# Patient Record
Sex: Female | Born: 1985
Health system: Southern US, Community
[De-identification: ages and names within clinical notes are randomized; demographics above are authoritative.]

## PROBLEM LIST (undated history)

## (undated) DIAGNOSIS — F419 Anxiety disorder, unspecified: Secondary | ICD-10-CM

---
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
## 2006-02-19 ENCOUNTER — Ambulatory Visit (HOSPITAL_COMMUNITY): Admission: RE | Admit: 2006-02-19 | Discharge: 2006-02-19 | Payer: Self-pay | Admitting: Obstetrics and Gynecology

## 2007-07-19 ENCOUNTER — Ambulatory Visit (HOSPITAL_COMMUNITY): Admission: RE | Admit: 2007-07-19 | Discharge: 2007-07-19 | Payer: Self-pay | Admitting: Obstetrics and Gynecology

## 2009-05-05 IMAGING — CR DG CHEST 2V
2 series · 2 of 2 positions shown · non-contrast
Comparison: Chest x-ray of 02/19/06.

CLINICAL DATA: History of positive PPD.  
 CHEST ? 2 VIEW:

[view not recorded (1 of 2)]
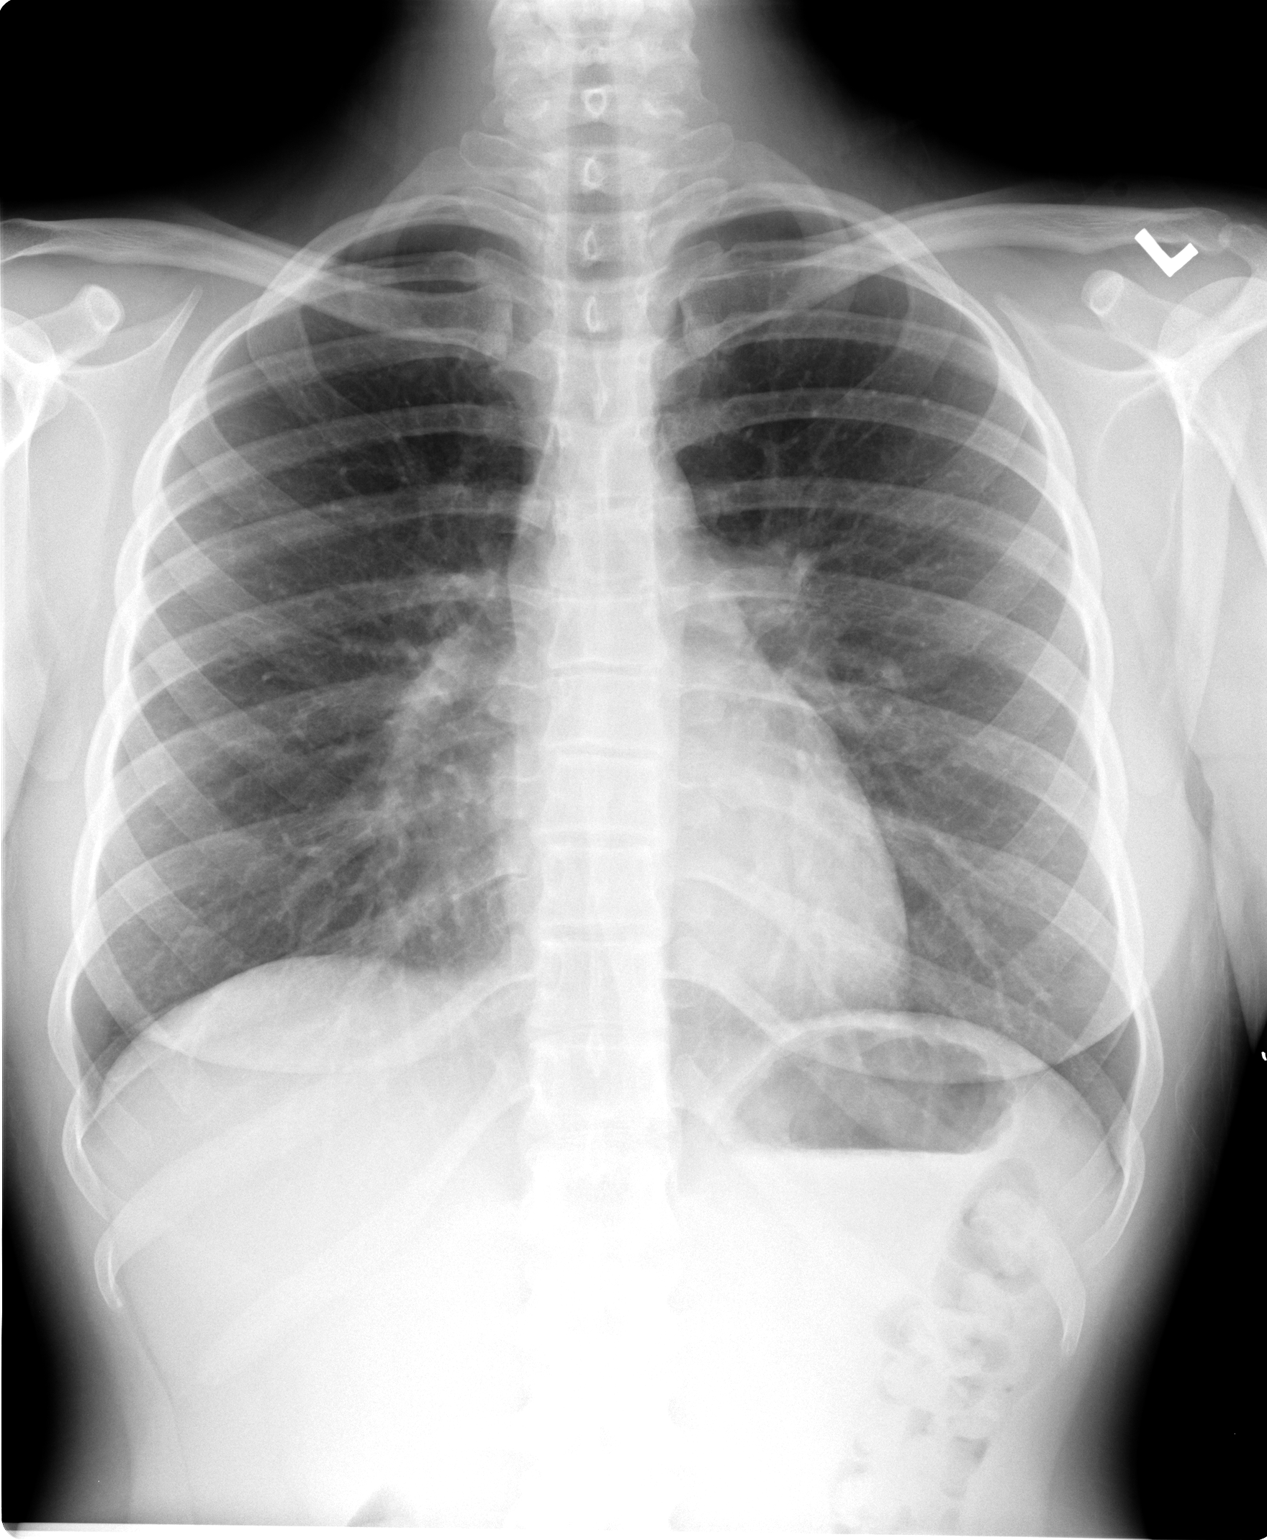

[view not recorded (2 of 2)]
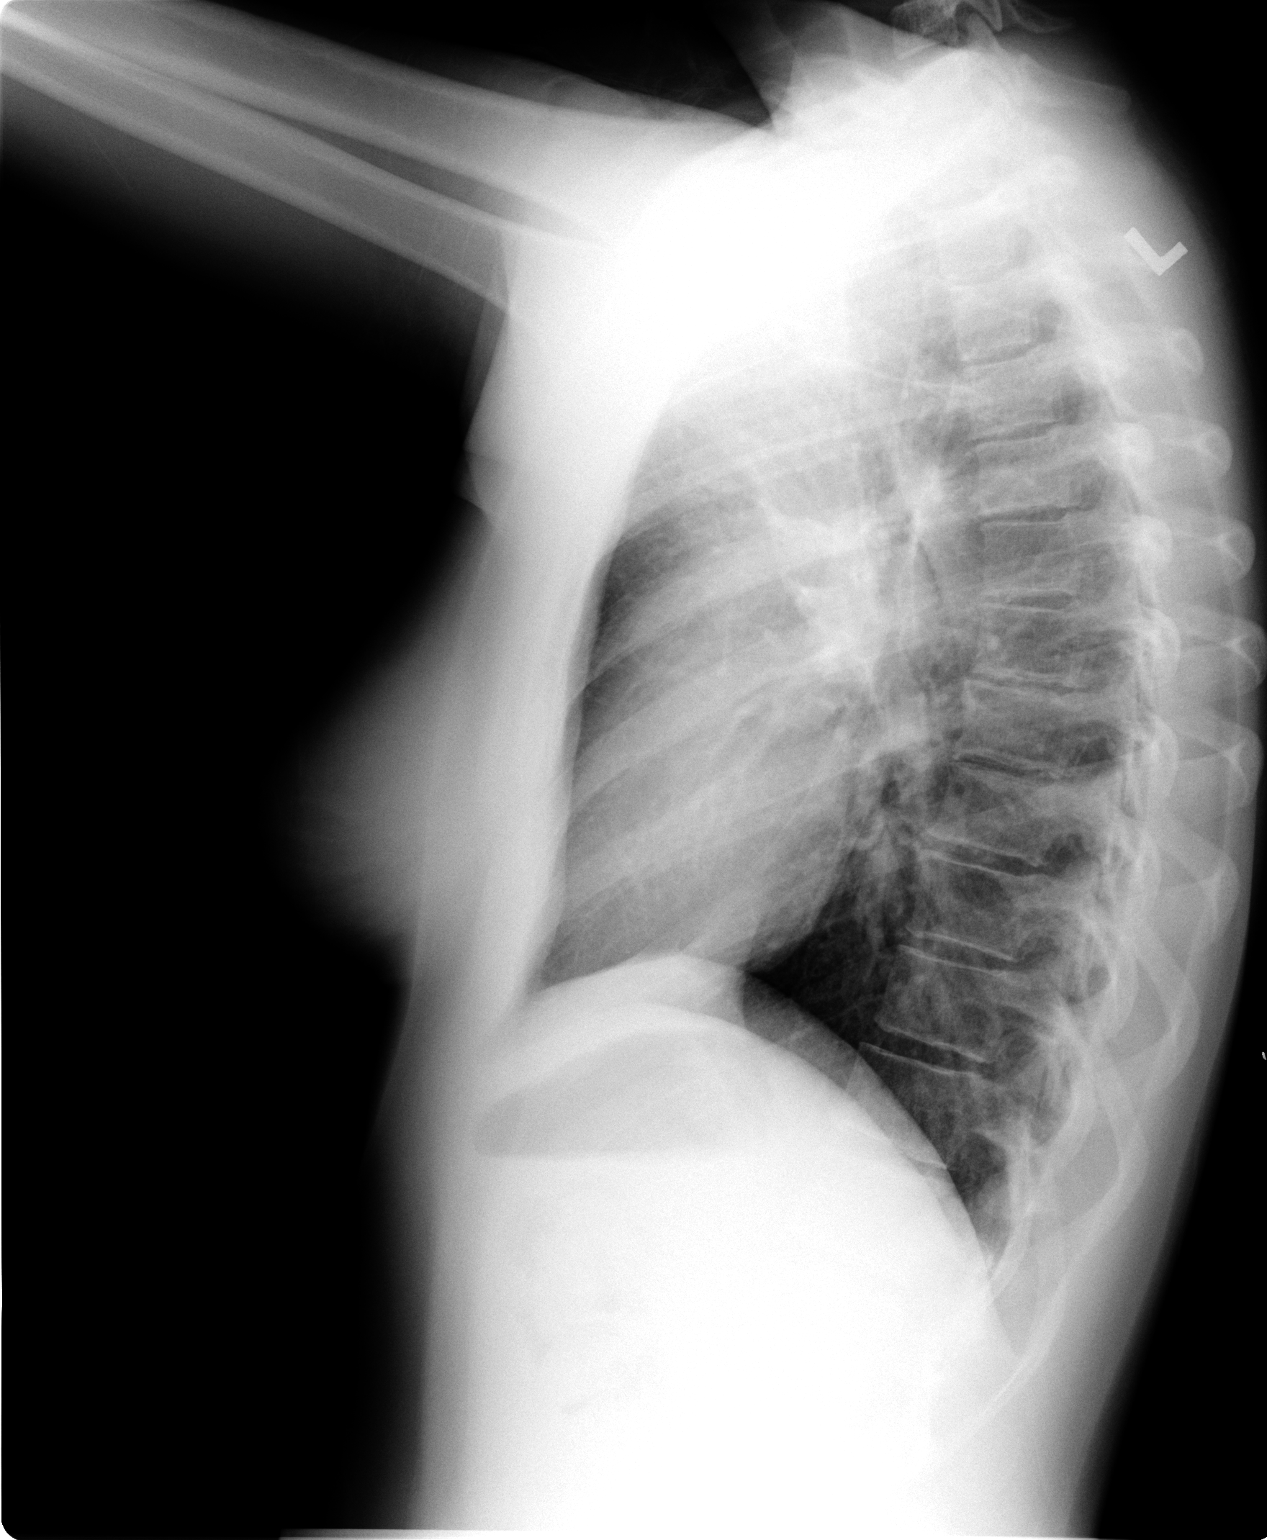

[2 of 2 positions shown; findings below may reference images not displayed]

FINDINGS: The cardiac silhouette, mediastinum and pulmonary vasculature are within normal limits.  Both lungs are clear.  The osseous structures are unremarkable.
IMPRESSION: Stable chest x-ray with no evidence of active cardiac or pulmonary disease.

## 2013-07-07 ENCOUNTER — Encounter (HOSPITAL_COMMUNITY): Payer: Self-pay | Admitting: Emergency Medicine

## 2013-07-07 ENCOUNTER — Emergency Department (HOSPITAL_COMMUNITY)
Admission: EM | Admit: 2013-07-07 | Discharge: 2013-07-07 | Discharge status: Against Medical Advice | Payer: BC Managed Care – PPO | Attending: Emergency Medicine | Admitting: Emergency Medicine

## 2013-07-07 DIAGNOSIS — F329 Major depressive disorder, single episode, unspecified: Secondary | ICD-10-CM | POA: Insufficient documentation

## 2013-07-07 DIAGNOSIS — F3289 Other specified depressive episodes: Secondary | ICD-10-CM | POA: Insufficient documentation

## 2013-07-07 NOTE — ED Notes (Signed)
Patient called for lab work at this time with no answer.

## 2013-07-07 NOTE — ED Notes (Signed)
Pt c/o feeling depressed 6 wks-2 months; no thoughts of harming self; no previous treatment for same; requesting help for depression;

## 2017-04-22 ENCOUNTER — Encounter (HOSPITAL_COMMUNITY): Payer: Self-pay | Admitting: Anesthesiology

## 2017-04-22 NOTE — Anesthesia Preprocedure Evaluation (Deleted)
Anesthesia Evaluation  Patient identified by MRN, date of birth, ID band Patient awake    Reviewed: Allergy & Precautions, H&P , Patient's Chart, lab work & pertinent test results, reviewed documented beta blocker date and time   Airway Mallampati: II  TM Distance: >3 FB Neck ROM: full    Dental no notable dental hx.    Pulmonary    Pulmonary exam normal breath sounds clear to auscultation       Cardiovascular  Rhythm:regular Rate:Normal     Neuro/Psych    GI/Hepatic   Endo/Other    Renal/GU      Musculoskeletal   Abdominal   Peds  Hematology   Anesthesia Other Findings   Reproductive/Obstetrics                             Anesthesia Physical Anesthesia Plan  ASA: II  Anesthesia Plan: General and MAC   Post-op Pain Management:    Induction: Intravenous  PONV Risk Score and Plan:   Airway Management Planned: LMA and Mask  Additional Equipment:   Intra-op Plan:   Post-operative Plan:   Informed Consent: I have reviewed the patients History and Physical, chart, labs and discussed the procedure including the risks, benefits and alternatives for the proposed anesthesia with the patient or authorized representative who has indicated his/her understanding and acceptance.   Dental Advisory Given  Plan Discussed with: CRNA and Surgeon  Anesthesia Plan Comments: ( MAC v LMA)        Anesthesia Quick Evaluation

## 2017-04-22 NOTE — H&P (Signed)
31 year old G 1 P 0 at 10 weeks presents for VIP No past medical history on file. No past surgical history on file. NKDA  Afebrile VSS General alert and oriented Lung CTAB Car RRR  Pelvic WNL 10 week size  Desires VIP D and E with ultrasound guidance Risks reviewed Consent signed

## 2017-04-23 ENCOUNTER — Ambulatory Visit (HOSPITAL_COMMUNITY)
Admission: RE | Admit: 2017-04-23 | Discharge: 2017-04-23 | Disposition: A | Discharge status: Home / Self Care | Payer: BLUE CROSS/BLUE SHIELD | Source: Ambulatory Visit | Attending: Obstetrics and Gynecology | Admitting: Obstetrics and Gynecology

## 2017-04-23 ENCOUNTER — Encounter (HOSPITAL_COMMUNITY)
Admission: RE | Disposition: A | Discharge status: Home / Self Care | Payer: Self-pay | Source: Ambulatory Visit | Attending: Obstetrics and Gynecology

## 2017-04-23 HISTORY — PX: DILATION AND EVACUATION: SHX1459

## 2017-04-23 SURGERY — DILATION AND EVACUATION, UTERUS
Anesthesia: Monitor Anesthesia Care

## 2017-04-23 MED ORDER — SCOPOLAMINE 1 MG/3DAYS TD PT72
MEDICATED_PATCH | TRANSDERMAL | Status: AC
  Filled 2017-04-23: qty 1

## 2017-04-23 MED ORDER — CEFOTETAN DISODIUM-DEXTROSE 2-2.08 GM-%(50ML) IV SOLR
INTRAVENOUS | Status: AC
  Filled 2017-04-23: qty 50

## 2017-04-23 MED ORDER — CEFOTETAN DISODIUM-DEXTROSE 2-2.08 GM-%(50ML) IV SOLR: 2.0000 g | INTRAVENOUS | Status: DC

## 2017-04-23 SURGICAL SUPPLY — 17 items
CATH ROBINSON RED A/P 16FR (CATHETERS) ×2 IMPLANT
DECANTER SPIKE VIAL GLASS SM (MISCELLANEOUS) ×2 IMPLANT
GLOVE BIO SURGEON STRL SZ 6.5 (GLOVE) ×4 IMPLANT
GLOVE BIOGEL PI IND STRL 7.0 (GLOVE) ×1 IMPLANT
GLOVE BIOGEL PI INDICATOR 7.0 (GLOVE) ×1
GOWN STRL REUS W/TWL LRG LVL3 (GOWN DISPOSABLE) ×4 IMPLANT
KIT BERKELEY 1ST TRIMESTER 3/8 (MISCELLANEOUS) ×2 IMPLANT
NS IRRIG 1000ML POUR BTL (IV SOLUTION) ×2 IMPLANT
PACK VAGINAL MINOR WOMEN LF (CUSTOM PROCEDURE TRAY) ×2 IMPLANT
PAD OB MATERNITY 4.3X12.25 (PERSONAL CARE ITEMS) ×2 IMPLANT
PAD PREP 24X48 CUFFED NSTRL (MISCELLANEOUS) ×2 IMPLANT
SET BERKELEY SUCTION TUBING (SUCTIONS) ×2 IMPLANT
TOWEL OR 17X24 6PK STRL BLUE (TOWEL DISPOSABLE) ×4 IMPLANT
VACURETTE 10 RIGID CVD (CANNULA) IMPLANT
VACURETTE 7MM CVD STRL WRAP (CANNULA) IMPLANT
VACURETTE 8 RIGID CVD (CANNULA) IMPLANT
VACURETTE 9 RIGID CVD (CANNULA) IMPLANT

## 2017-04-26 ENCOUNTER — Encounter (HOSPITAL_COMMUNITY): Payer: Self-pay | Admitting: Obstetrics and Gynecology

## 2017-10-19 LAB — OB RESULTS CONSOLE GBS: GBS: NEGATIVE

## 2017-11-12 ENCOUNTER — Inpatient Hospital Stay (HOSPITAL_COMMUNITY): Payer: BLUE CROSS/BLUE SHIELD | Admitting: Anesthesiology

## 2017-11-12 ENCOUNTER — Encounter (HOSPITAL_COMMUNITY)
Admission: AD | Disposition: A | Discharge status: Home / Self Care | Payer: Self-pay | Source: Ambulatory Visit | Attending: Obstetrics & Gynecology

## 2017-11-12 ENCOUNTER — Other Ambulatory Visit: Payer: Self-pay

## 2017-11-12 ENCOUNTER — Encounter (HOSPITAL_COMMUNITY): Payer: Self-pay | Admitting: *Deleted

## 2017-11-12 ENCOUNTER — Inpatient Hospital Stay (HOSPITAL_COMMUNITY)
Admission: AD | Admit: 2017-11-12 | Discharge: 2017-11-15 | DRG: 787 | Disposition: A | Discharge status: Home / Self Care | Payer: BLUE CROSS/BLUE SHIELD | Source: Ambulatory Visit | Attending: Obstetrics & Gynecology | Admitting: Obstetrics & Gynecology

## 2017-11-12 DIAGNOSIS — Z3483 Encounter for supervision of other normal pregnancy, third trimester: Secondary | ICD-10-CM | POA: Diagnosis present

## 2017-11-12 DIAGNOSIS — Z3A39 39 weeks gestation of pregnancy: Secondary | ICD-10-CM | POA: Diagnosis not present

## 2017-11-12 DIAGNOSIS — O4292 Full-term premature rupture of membranes, unspecified as to length of time between rupture and onset of labor: Secondary | ICD-10-CM | POA: Diagnosis present

## 2017-11-12 DIAGNOSIS — Z98891 History of uterine scar from previous surgery: Secondary | ICD-10-CM

## 2017-11-12 DIAGNOSIS — O4290 Premature rupture of membranes, unspecified as to length of time between rupture and onset of labor, unspecified weeks of gestation: Secondary | ICD-10-CM | POA: Diagnosis present

## 2017-11-12 LAB — CBC
HEMATOCRIT: 37.9 % (ref 36.0–46.0)
Hemoglobin: 12.9 g/dL (ref 12.0–15.0)
MCH: 31 pg (ref 26.0–34.0)
MCHC: 34 g/dL (ref 30.0–36.0)
MCV: 91.1 fL (ref 78.0–100.0)
Platelets: 310 10*3/uL (ref 150–400)
RBC: 4.16 MIL/uL (ref 3.87–5.11)
RDW: 13.9 % (ref 11.5–15.5)
WBC: 7.3 10*3/uL (ref 4.0–10.5)

## 2017-11-12 LAB — RPR: RPR Ser Ql: NONREACTIVE

## 2017-11-12 LAB — TYPE AND SCREEN
ABO/RH(D): O POS
Antibody Screen: NEGATIVE

## 2017-11-12 LAB — ABO/RH: ABO/RH(D): O POS

## 2017-11-12 LAB — POCT FERN TEST

## 2017-11-12 SURGERY — Surgical Case
Anesthesia: Regional

## 2017-11-12 MED ORDER — SIMETHICONE 80 MG PO CHEW
80.0000 mg | CHEWABLE_TABLET | Freq: Three times a day (TID) | ORAL | Status: DC
  Administered 2017-11-13 – 2017-11-14 (×6): 80 mg via ORAL
  Filled 2017-11-12 (×6): qty 1

## 2017-11-12 MED ORDER — FENTANYL CITRATE (PF) 100 MCG/2ML IJ SOLN
INTRAMUSCULAR | Status: AC
  Filled 2017-11-12: qty 2

## 2017-11-12 MED ORDER — SODIUM BICARBONATE 8.4 % IV SOLN
INTRAVENOUS | Status: AC
  Filled 2017-11-12: qty 50

## 2017-11-12 MED ORDER — ACETAMINOPHEN 325 MG PO TABS
650.0000 mg | ORAL_TABLET | ORAL | Status: DC | PRN
  Administered 2017-11-13 – 2017-11-14 (×2): 650 mg via ORAL
  Filled 2017-11-12 (×3): qty 2

## 2017-11-12 MED ORDER — OXYTOCIN 10 UNIT/ML IJ SOLN
INTRAVENOUS | Status: DC | PRN
  Administered 2017-11-12: 40 [IU] via INTRAVENOUS

## 2017-11-12 MED ORDER — SIMETHICONE 80 MG PO CHEW
80.0000 mg | CHEWABLE_TABLET | ORAL | Status: DC
  Administered 2017-11-13 – 2017-11-14 (×2): 80 mg via ORAL
  Filled 2017-11-12 (×2): qty 1

## 2017-11-12 MED ORDER — KETOROLAC TROMETHAMINE 60 MG/2ML IM SOLN
INTRAMUSCULAR | Status: AC
  Filled 2017-11-12: qty 2

## 2017-11-12 MED ORDER — OXYTOCIN 40 UNITS IN LACTATED RINGERS INFUSION - SIMPLE MED
1.0000 m[IU]/min | INTRAVENOUS | Status: DC
  Administered 2017-11-12: 2 m[IU]/min via INTRAVENOUS
  Filled 2017-11-12: qty 1000

## 2017-11-12 MED ORDER — LACTATED RINGERS IV SOLN
INTRAVENOUS | Status: DC
  Administered 2017-11-13: 03:00:00 via INTRAVENOUS

## 2017-11-12 MED ORDER — PROPOFOL 10 MG/ML IV BOLUS
INTRAVENOUS | Status: AC
  Filled 2017-11-12: qty 20

## 2017-11-12 MED ORDER — EPHEDRINE 5 MG/ML INJ: 10.0000 mg | INTRAVENOUS | Status: DC | PRN

## 2017-11-12 MED ORDER — CLINDAMYCIN PHOSPHATE 900 MG/50ML IV SOLN
900.0000 mg | Freq: Once | INTRAVENOUS | Status: AC
  Administered 2017-11-12: 900 mg via INTRAVENOUS
  Filled 2017-11-12: qty 50

## 2017-11-12 MED ORDER — OXYTOCIN 10 UNIT/ML IJ SOLN
INTRAMUSCULAR | Status: AC
  Filled 2017-11-12: qty 1

## 2017-11-12 MED ORDER — LACTATED RINGERS IV SOLN
500.0000 mL | INTRAVENOUS | Status: DC | PRN
  Administered 2017-11-12: 1000 mL via INTRAVENOUS
  Administered 2017-11-12: 500 mL via INTRAVENOUS

## 2017-11-12 MED ORDER — SODIUM CHLORIDE 0.9 % IR SOLN
Status: DC | PRN
  Administered 2017-11-12: 1000 mL

## 2017-11-12 MED ORDER — MORPHINE SULFATE (PF) 0.5 MG/ML IJ SOLN
INTRAMUSCULAR | Status: AC
Start: 2017-11-12 — End: ?
  Filled 2017-11-12: qty 10

## 2017-11-12 MED ORDER — DIBUCAINE 1 % RE OINT: 1.0000 "application " | TOPICAL_OINTMENT | RECTAL | Status: DC | PRN

## 2017-11-12 MED ORDER — MENTHOL 3 MG MT LOZG: 1.0000 | LOZENGE | OROMUCOSAL | Status: DC | PRN

## 2017-11-12 MED ORDER — OXYCODONE-ACETAMINOPHEN 5-325 MG PO TABS
2.0000 | ORAL_TABLET | ORAL | Status: DC | PRN
Start: 2017-11-12 — End: 2017-11-12

## 2017-11-12 MED ORDER — DEXAMETHASONE SODIUM PHOSPHATE 10 MG/ML IJ SOLN
INTRAMUSCULAR | Status: DC | PRN
  Administered 2017-11-12: 10 mg via INTRAVENOUS

## 2017-11-12 MED ORDER — DIPHENHYDRAMINE HCL 50 MG/ML IJ SOLN: 12.5000 mg | INTRAMUSCULAR | Status: DC | PRN

## 2017-11-12 MED ORDER — SENNOSIDES-DOCUSATE SODIUM 8.6-50 MG PO TABS
2.0000 | ORAL_TABLET | ORAL | Status: DC
  Administered 2017-11-13 – 2017-11-14 (×2): 2 via ORAL
  Filled 2017-11-12 (×2): qty 2

## 2017-11-12 MED ORDER — GENTAMICIN SULFATE 40 MG/ML IJ SOLN
Freq: Once | INTRAVENOUS | Status: AC
  Administered 2017-11-12: 19:00:00 via INTRAVENOUS
  Filled 2017-11-12: qty 4

## 2017-11-12 MED ORDER — LACTATED RINGERS IV SOLN
INTRAVENOUS | Status: DC | PRN
  Administered 2017-11-12: 21:00:00 via INTRAVENOUS

## 2017-11-12 MED ORDER — ONDANSETRON HCL 4 MG/2ML IJ SOLN
4.0000 mg | Freq: Four times a day (QID) | INTRAMUSCULAR | Status: DC | PRN
  Administered 2017-11-12: 4 mg via INTRAVENOUS
  Filled 2017-11-12: qty 2

## 2017-11-12 MED ORDER — ZOLPIDEM TARTRATE 5 MG PO TABS: 5.0000 mg | ORAL_TABLET | Freq: Every evening | ORAL | Status: DC | PRN

## 2017-11-12 MED ORDER — DIPHENHYDRAMINE HCL 25 MG PO CAPS
25.0000 mg | ORAL_CAPSULE | Freq: Four times a day (QID) | ORAL | Status: DC | PRN
  Filled 2017-11-12: qty 1

## 2017-11-12 MED ORDER — SCOPOLAMINE 1 MG/3DAYS TD PT72
MEDICATED_PATCH | TRANSDERMAL | Status: DC | PRN
  Administered 2017-11-12: 1 via TRANSDERMAL

## 2017-11-12 MED ORDER — PHENYLEPHRINE 40 MCG/ML (10ML) SYRINGE FOR IV PUSH (FOR BLOOD PRESSURE SUPPORT)
PREFILLED_SYRINGE | INTRAVENOUS | Status: AC
  Filled 2017-11-12: qty 10

## 2017-11-12 MED ORDER — PRENATAL MULTIVITAMIN CH
1.0000 | ORAL_TABLET | Freq: Every day | ORAL | Status: DC
  Administered 2017-11-13 – 2017-11-14 (×2): 1 via ORAL
  Filled 2017-11-12 (×2): qty 1

## 2017-11-12 MED ORDER — SIMETHICONE 80 MG PO CHEW: 80.0000 mg | CHEWABLE_TABLET | ORAL | Status: DC | PRN

## 2017-11-12 MED ORDER — FENTANYL 2.5 MCG/ML BUPIVACAINE 1/10 % EPIDURAL INFUSION (WH - ANES)
14.0000 mL/h | INTRAMUSCULAR | Status: DC | PRN
  Administered 2017-11-12: 14 mL/h via EPIDURAL
  Filled 2017-11-12: qty 100

## 2017-11-12 MED ORDER — WITCH HAZEL-GLYCERIN EX PADS: 1.0000 "application " | MEDICATED_PAD | CUTANEOUS | Status: DC | PRN

## 2017-11-12 MED ORDER — MIDAZOLAM HCL 2 MG/2ML IJ SOLN: 0.5000 mg | Freq: Once | INTRAMUSCULAR | Status: DC | PRN

## 2017-11-12 MED ORDER — TETANUS-DIPHTH-ACELL PERTUSSIS 5-2.5-18.5 LF-MCG/0.5 IM SUSP: 0.5000 mL | Freq: Once | INTRAMUSCULAR | Status: DC

## 2017-11-12 MED ORDER — KETOROLAC TROMETHAMINE 30 MG/ML IJ SOLN
INTRAMUSCULAR | Status: AC
  Filled 2017-11-12: qty 1

## 2017-11-12 MED ORDER — ONDANSETRON HCL 4 MG/2ML IJ SOLN
INTRAMUSCULAR | Status: AC
  Filled 2017-11-12: qty 2

## 2017-11-12 MED ORDER — FLEET ENEMA 7-19 GM/118ML RE ENEM: 1.0000 | ENEMA | RECTAL | Status: DC | PRN

## 2017-11-12 MED ORDER — TERBUTALINE SULFATE 1 MG/ML IJ SOLN: 0.2500 mg | Freq: Once | INTRAMUSCULAR | Status: DC | PRN

## 2017-11-12 MED ORDER — FENTANYL 2.5 MCG/ML BUPIVACAINE 1/10 % EPIDURAL INFUSION (WH - ANES)
INTRAMUSCULAR | Status: AC
  Filled 2017-11-12: qty 100

## 2017-11-12 MED ORDER — LIDOCAINE HCL (PF) 1 % IJ SOLN: 30.0000 mL | INTRAMUSCULAR | Status: DC | PRN

## 2017-11-12 MED ORDER — SODIUM BICARBONATE 8.4 % IV SOLN
INTRAVENOUS | Status: DC | PRN
  Administered 2017-11-12: 5 mL via EPIDURAL
  Administered 2017-11-12: 3 mL via EPIDURAL

## 2017-11-12 MED ORDER — OXYTOCIN BOLUS FROM INFUSION: 500.0000 mL | Freq: Once | INTRAVENOUS | Status: DC

## 2017-11-12 MED ORDER — BUTORPHANOL TARTRATE 1 MG/ML IJ SOLN: 1.0000 mg | INTRAMUSCULAR | Status: DC | PRN

## 2017-11-12 MED ORDER — OXYCODONE-ACETAMINOPHEN 5-325 MG PO TABS
1.0000 | ORAL_TABLET | ORAL | Status: DC | PRN
  Administered 2017-11-14 (×2): 1 via ORAL
  Filled 2017-11-12 (×3): qty 1

## 2017-11-12 MED ORDER — LIDOCAINE-EPINEPHRINE (PF) 2 %-1:200000 IJ SOLN
INTRAMUSCULAR | Status: DC | PRN
  Administered 2017-11-12 (×2): 3 mL via EPIDURAL
  Administered 2017-11-12: 5 mL via EPIDURAL

## 2017-11-12 MED ORDER — PROMETHAZINE HCL 25 MG/ML IJ SOLN: 6.2500 mg | INTRAMUSCULAR | Status: DC | PRN

## 2017-11-12 MED ORDER — ONDANSETRON HCL 4 MG/2ML IJ SOLN
INTRAMUSCULAR | Status: DC | PRN
  Administered 2017-11-12: 4 mg via INTRAVENOUS

## 2017-11-12 MED ORDER — DEXAMETHASONE SODIUM PHOSPHATE 10 MG/ML IJ SOLN
INTRAMUSCULAR | Status: AC
  Filled 2017-11-12: qty 1

## 2017-11-12 MED ORDER — COCONUT OIL OIL
1.0000 "application " | TOPICAL_OIL | Status: DC | PRN
  Administered 2017-11-14: 1 via TOPICAL
  Filled 2017-11-12: qty 120

## 2017-11-12 MED ORDER — FENTANYL CITRATE (PF) 100 MCG/2ML IJ SOLN
INTRAMUSCULAR | Status: DC | PRN
  Administered 2017-11-12: 100 ug via EPIDURAL
  Administered 2017-11-12: 100 ug via INTRAVENOUS

## 2017-11-12 MED ORDER — PHENYLEPHRINE 40 MCG/ML (10ML) SYRINGE FOR IV PUSH (FOR BLOOD PRESSURE SUPPORT): 80.0000 ug | PREFILLED_SYRINGE | INTRAVENOUS | Status: DC | PRN

## 2017-11-12 MED ORDER — NALBUPHINE HCL 10 MG/ML IJ SOLN: 5.0000 mg | INTRAMUSCULAR | Status: DC | PRN

## 2017-11-12 MED ORDER — FENTANYL CITRATE (PF) 100 MCG/2ML IJ SOLN
50.0000 ug | Freq: Once | INTRAMUSCULAR | Status: AC
  Administered 2017-11-12: 50 ug via INTRAVENOUS

## 2017-11-12 MED ORDER — SOD CITRATE-CITRIC ACID 500-334 MG/5ML PO SOLN
30.0000 mL | ORAL | Status: DC | PRN
  Administered 2017-11-12: 30 mL via ORAL
  Filled 2017-11-12: qty 15

## 2017-11-12 MED ORDER — LACTATED RINGERS IV SOLN
INTRAVENOUS | Status: DC
  Administered 2017-11-12 (×2): via INTRAVENOUS

## 2017-11-12 MED ORDER — OXYTOCIN 40 UNITS IN LACTATED RINGERS INFUSION - SIMPLE MED: 2.5000 [IU]/h | INTRAVENOUS | Status: DC

## 2017-11-12 MED ORDER — OXYCODONE-ACETAMINOPHEN 5-325 MG PO TABS: 2.0000 | ORAL_TABLET | ORAL | Status: DC | PRN

## 2017-11-12 MED ORDER — SODIUM CHLORIDE 0.9 % IV SOLN
2.0000 g | Freq: Once | INTRAVENOUS | Status: AC
  Administered 2017-11-12: 2 g via INTRAVENOUS
  Filled 2017-11-12: qty 2

## 2017-11-12 MED ORDER — MEPERIDINE HCL 25 MG/ML IJ SOLN: 6.2500 mg | INTRAMUSCULAR | Status: DC | PRN

## 2017-11-12 MED ORDER — ACETAMINOPHEN 325 MG PO TABS
650.0000 mg | ORAL_TABLET | ORAL | Status: DC | PRN
  Administered 2017-11-12: 650 mg via ORAL
  Filled 2017-11-12: qty 2

## 2017-11-12 MED ORDER — KETOROLAC TROMETHAMINE 30 MG/ML IJ SOLN
30.0000 mg | Freq: Once | INTRAMUSCULAR | Status: AC
  Administered 2017-11-12: 30 mg via INTRAVENOUS

## 2017-11-12 MED ORDER — LIDOCAINE-EPINEPHRINE (PF) 2 %-1:200000 IJ SOLN
INTRAMUSCULAR | Status: AC
  Filled 2017-11-12: qty 20

## 2017-11-12 MED ORDER — MORPHINE SULFATE (PF) 0.5 MG/ML IJ SOLN
INTRAMUSCULAR | Status: DC | PRN
  Administered 2017-11-12: 3 mg via EPIDURAL

## 2017-11-12 MED ORDER — LIDOCAINE HCL (PF) 1 % IJ SOLN
INTRAMUSCULAR | Status: DC | PRN
  Administered 2017-11-12 (×2): 5 mL via EPIDURAL

## 2017-11-12 MED ORDER — OXYCODONE-ACETAMINOPHEN 5-325 MG PO TABS: 1.0000 | ORAL_TABLET | ORAL | Status: DC | PRN

## 2017-11-12 MED ORDER — OXYTOCIN 40 UNITS IN LACTATED RINGERS INFUSION - SIMPLE MED: 1.0000 m[IU]/min | INTRAVENOUS | Status: DC

## 2017-11-12 MED ORDER — IBUPROFEN 600 MG PO TABS
600.0000 mg | ORAL_TABLET | Freq: Four times a day (QID) | ORAL | Status: DC
  Administered 2017-11-13 – 2017-11-15 (×9): 600 mg via ORAL
  Filled 2017-11-12 (×9): qty 1

## 2017-11-12 MED ORDER — LACTATED RINGERS IV SOLN: 500.0000 mL | Freq: Once | INTRAVENOUS | Status: DC

## 2017-11-12 MED ORDER — MORPHINE SULFATE (PF) 4 MG/ML IV SOLN: 1.0000 mg | INTRAVENOUS | Status: DC | PRN

## 2017-11-12 MED ORDER — OXYTOCIN 40 UNITS IN LACTATED RINGERS INFUSION - SIMPLE MED: 2.5000 [IU]/h | INTRAVENOUS | Status: AC

## 2017-11-12 SURGICAL SUPPLY — 34 items
ADH SKN CLS APL DERMABOND .7 (GAUZE/BANDAGES/DRESSINGS)
APL SKNCLS STERI-STRIP NONHPOA (GAUZE/BANDAGES/DRESSINGS) ×1
BENZOIN TINCTURE PRP APPL 2/3 (GAUZE/BANDAGES/DRESSINGS) ×2 IMPLANT
CHLORAPREP W/TINT 26ML (MISCELLANEOUS) ×2 IMPLANT
CLAMP CORD UMBIL (MISCELLANEOUS) IMPLANT
CLOTH BEACON ORANGE TIMEOUT ST (SAFETY) ×2 IMPLANT
CLSR STERI-STRIP ANTIMIC 1/2X4 (GAUZE/BANDAGES/DRESSINGS) ×2 IMPLANT
DERMABOND ADVANCED (GAUZE/BANDAGES/DRESSINGS)
DERMABOND ADVANCED .7 DNX12 (GAUZE/BANDAGES/DRESSINGS) IMPLANT
DRSG OPSITE POSTOP 4X10 (GAUZE/BANDAGES/DRESSINGS) ×2 IMPLANT
ELECT REM PT RETURN 9FT ADLT (ELECTROSURGICAL) ×2
ELECTRODE REM PT RTRN 9FT ADLT (ELECTROSURGICAL) ×1 IMPLANT
EXTRACTOR VACUUM KIWI (MISCELLANEOUS) IMPLANT
GLOVE BIO SURGEON STRL SZ 6 (GLOVE) ×2 IMPLANT
GLOVE BIOGEL PI IND STRL 6 (GLOVE) ×2 IMPLANT
GLOVE BIOGEL PI IND STRL 7.0 (GLOVE) ×1 IMPLANT
GLOVE BIOGEL PI INDICATOR 6 (GLOVE) ×2
GLOVE BIOGEL PI INDICATOR 7.0 (GLOVE) ×1
GOWN STRL REUS W/TWL LRG LVL3 (GOWN DISPOSABLE) ×4 IMPLANT
KIT ABG SYR 3ML LUER SLIP (SYRINGE) ×2 IMPLANT
NEEDLE HYPO 25X5/8 SAFETYGLIDE (NEEDLE) ×2 IMPLANT
NS IRRIG 1000ML POUR BTL (IV SOLUTION) ×2 IMPLANT
PACK C SECTION WH (CUSTOM PROCEDURE TRAY) ×2 IMPLANT
PAD OB MATERNITY 4.3X12.25 (PERSONAL CARE ITEMS) ×2 IMPLANT
PENCIL SMOKE EVAC W/HOLSTER (ELECTROSURGICAL) ×2 IMPLANT
STRIP CLOSURE SKIN 1/2X4 (GAUZE/BANDAGES/DRESSINGS) IMPLANT
SUT CHROMIC 0 CTX 36 (SUTURE) ×6 IMPLANT
SUT MON AB 2-0 CT1 27 (SUTURE) ×2 IMPLANT
SUT PDS AB 0 CT1 27 (SUTURE) IMPLANT
SUT PLAIN 0 NONE (SUTURE) IMPLANT
SUT VIC AB 0 CT1 36 (SUTURE) IMPLANT
SUT VIC AB 4-0 KS 27 (SUTURE) IMPLANT
TOWEL OR 17X24 6PK STRL BLUE (TOWEL DISPOSABLE) ×2 IMPLANT
TRAY FOLEY W/BAG SLVR 14FR LF (SET/KITS/TRAYS/PACK) IMPLANT

## 2017-11-12 NOTE — Anesthesia Pain Management Evaluation Note (Signed)
  CRNA Pain Management Visit Note  Patient: Brianna Bell, 32 y.o., female  "Hello I am a member of the anesthesia team at [facility]. We have an anesthesia team available at all times to provide care throughout the hospital, including epidural management and anesthesia for C-section. I don't know your plan for the delivery whether it a natural birth, water birth, IV sedation, nitrous supplementation, doula or epidural, but we want to meet your pain goals."   1.Was your pain managed to your expectations on prior hospitalizations?   No prior hospitalizations  2.What is your expectation for pain management during this hospitalization?     Epidural  3.How can we help you reach that goal? Support prn  Record the patient's initial score and the patient's pain goal.   Pain: 2  Pain Goal: 4 The [facility] wants you to be able to say your pain was always managed very well.  [facility] 11/12/2017

## 2017-11-12 NOTE — MAU Note (Signed)
Water broke at 0345 cloudy fluid

## 2017-11-12 NOTE — Progress Notes (Signed)
CVX 8/50 with edema/-1.   MVUs adequate x 2 h.  Comfortable now after CLEA redose Recommend C/S for arrest of dilation.  Patient is counseled re: risk of bleeding, infection, scarring, and damage to surrounding structures.  She understands 1% risk of uterine rupture in subsequent pregnancies.  All questions were answered and the patient wishes to proceed.  Mitchel Honour, DO

## 2017-11-12 NOTE — Consult Note (Signed)
Neonatology Note:   Attendance at C-section:    I was asked by Dr. Langston Masker to attend this primary C/S at term due to failure of dilatation. The mother is a G1P0 O pos, GBS neg with an uncomplicated pregnancy. ROM 18 hours prior to delivery, fluid clear. Mother developed temperature elevation to 100.5 degrees and was treated with 1 dose each of Ampicillin and Gentamicin about 2-2.5 hours before delivery. Some FHR decelerations during labor. Infant vigorous with good spontaneous cry and tone. Delayed cord clamping was done. Needed only minimal bulb suctioning. Ap 8/9. Lungs clear to ausc in DR. Infant is able to remain with his mother for skin to skin time under nursing supervision. Transferred to the care of Pediatrician.   Doretha Sou, MD

## 2017-11-12 NOTE — Op Note (Signed)
Brianna Bell PROCEDURE DATE: 11/12/2017  PREOPERATIVE DIAGNOSIS: Intrauterine pregnancy at  [redacted]w[redacted]d weeks gestation, arrest of dilation  POSTOPERATIVE DIAGNOSIS: The same with OP presentation  PROCEDURE:  Primary Low Transverse Cesarean Section  SURGEON:  Dr. Mitchel Honour  INDICATIONS: Brianna Bell is a 32 y.o. G1P0 at [redacted]w[redacted]d scheduled for cesarean section secondary to arrest of dilation.  The risks of cesarean section discussed with the patient included but were not limited to: bleeding which may require transfusion or reoperation; infection which may require antibiotics; injury to bowel, bladder, ureters or other surrounding organs; injury to the fetus; need for additional procedures including hysterectomy in the event of a life-threatening hemorrhage; placental abnormalities wth subsequent pregnancies, incisional problems, thromboembolic phenomenon and other postoperative/anesthesia complications. The patient concurred with the proposed plan, giving informed written consent for the procedure.    FINDINGS:  Viable female infant in cephalic presentation (OP), APGARs 8,9: weight pending  Clear amniotic fluid.  Intact placenta, three vessel cord.  Grossly normal uterus, ovaries and fallopian tubes. .   ANESTHESIA:    Epidural ESTIMATED BLOOD LOSS: 628 mL ml SPECIMENS: Placenta sent to L&D COMPLICATIONS: None immediate  PROCEDURE IN DETAIL:  The patient received intravenous antibiotics and had sequential compression devices applied to her lower extremities while in the preoperative area.  She was then taken to the operating room where epidural anesthesia was dosed up to surgical level and was found to be adequate. She was then placed in a dorsal supine position with a leftward tilt, and prepped and draped in a sterile manner.  A foley catheter was placed into her bladder and attached to constant gravity.  After an adequate timeout was performed, a Pfannenstiel skin incision was made with scalpel  and carried through to the underlying layer of fascia. The fascia was incised in the midline and this incision was extended bilaterally using the Mayo scissors. Kocher clamps were applied to the superior aspect of the fascial incision and the underlying rectus muscles were dissected off bluntly. A similar process was carried out on the inferior aspect of the facial incision. The rectus muscles were separated in the midline bluntly and the peritoneum was entered bluntly.  Bladder flap was created sharply and developed bluntly.  Bladder was protected behind the bladder blade. A transverse hysterotomy was made with a scalpel and extended bilaterally bluntly. The bladder blade was then removed. The infant was successfully delivered, and cord was clamped and cut and infant was handed over to awaiting neonatology team. Uterine massage was then administered and the placenta delivered intact with three-vessel cord. The uterus was cleared of clot and debris.  The hysterotomy was closed with 0 chromic.  A second imbricating suture of 0-chromic was used to reinforce the incision and aid in hemostasis.  The peritoneum and rectus muscles were noted to be hemostatic and were reapproximated using 3-0 monocryl in a running fashion.  The fascia was closed with 0-Vicryl in a running fashion with good restoration of anatomy.  The subcutaneus tissue was copiously irrigated.  The skin was closed with 4-0 vicryl in a subcuticular fashion.  Pt tolerated the procedure will.  All counts were correct x2.  Pt went to the recovery room in stable condition.

## 2017-11-12 NOTE — H&P (Signed)
Brianna Bell is a 32 y.o. female presenting for SROM at 47.  CTX have started to increase in intensity.  No VB.  Active FM.  Antepartum course uncomplicated.  GBS negative.  OB History    Gravida  1   Para      Term      Preterm      AB      Living        SAB      TAB      Ectopic      Multiple      Live Births             History reviewed. No pertinent past medical history. Past Surgical History:  Procedure Laterality Date  . DILATION AND EVACUATION N/A 04/23/2017   Procedure: DILATATION AND EVACUATION with ultrasound guidance;  Surgeon: Marcelle Overlie, MD;  Location: [facility];  Service: Gynecology;  Laterality: N/A;   Family History: family history is not on file. Social History:  reports that she has never smoked. She has never used smokeless tobacco. She reports that she drank alcohol. She reports that she does not use drugs.     Maternal Diabetes: No Genetic Screening: Normal Maternal Ultrasounds/Referrals: Normal Fetal Ultrasounds or other Referrals:  None Maternal Substance Abuse:  No Significant Maternal Medications:  None Significant Maternal Lab Results:  Lab values include: Group B Strep negative Other Comments:  None  ROS Maternal Medical History:  Reason for admission: Rupture of membranes.   Contractions: Onset was 3-5 hours ago.   Frequency: irregular.   Perceived severity is mild.    Fetal activity: Perceived fetal activity is normal.   Last perceived fetal movement was within the past hour.    Prenatal complications: no prenatal complications Prenatal Complications - Diabetes: none.    Dilation: 1 Effacement (%): 70 Station: -3 Exam by:: [email or web address]-[email or web address], rnc  Blood pressure 127/84, pulse 92, temperature 97.8 F (36.6 C), temperature source Oral, resp. rate 14, height  (1.626 m), weight 180 lb 12 oz (82 kg). Maternal Exam:  Uterine Assessment: Contraction strength is mild.  Contraction frequency is irregular.   Abdomen:  Patient reports no abdominal tenderness. Fundal height is c/w dates.   Estimated fetal weight is 7#8.       Physical Exam  Constitutional: She is oriented to person, place, and time. She appears well-developed and well-nourished.  GI: Soft. There is no rebound and no guarding.  Neurological: She is alert and oriented to person, place, and time.  Skin: Skin is warm and dry.  Psychiatric: She has a normal mood and affect. Her behavior is normal.    Prenatal labs: ABO, Rh:   Antibody:   Rubella:   RPR:    HBsAg:    HIV:    GBS:     Assessment/Plan: 32yo G1 at [redacted]w[redacted]d with labor -Augment prn -Epidural if desired -Anticipate NSVD    Holland Kotter 11/12/2017, 7:58 AM

## 2017-11-12 NOTE — Transfer of Care (Signed)
Immediate Anesthesia Transfer of Care Note  Patient: Brianna Bell  Procedure(s) Performed: CESAREAN SECTION (N/A )  Patient Location: PACU  Anesthesia Type:Epidural  Level of Consciousness: awake, alert , oriented and patient cooperative  Airway & Oxygen Therapy: Patient Spontanous Breathing  Post-op Assessment: Report given to RN and Post -op Vital signs reviewed and stable  Post vital signs: Reviewed and stable  Last Vitals:  Vitals Value Taken Time  BP    Temp    Pulse    Resp    SpO2      Last Pain:  Vitals:   11/12/17 1925  TempSrc:   PainSc: 9          Complications: No apparent anesthesia complications

## 2017-11-12 NOTE — Anesthesia Procedure Notes (Signed)
Epidural Patient location during procedure: OB  Staffing Anesthesiologist: Cowan Pilar, MD Performed: anesthesiologist   Preanesthetic Checklist Completed: patient identified, site marked, surgical consent, pre-op evaluation, timeout performed, IV checked, risks and benefits discussed and monitors and equipment checked  Epidural Patient position: sitting Prep: DuraPrep Patient monitoring: heart rate, continuous pulse ox and blood pressure Approach: right paramedian Location: L3-L4 Injection technique: LOR saline  Needle:  Needle type: Tuohy  Needle gauge: 17 G Needle length: 9 cm and 9 Needle insertion depth: 6 cm Catheter type: closed end flexible Catheter size: 20 Guage Catheter at skin depth: 10 cm Test dose: negative  Assessment Events: blood not aspirated, injection not painful, no injection resistance, negative IV test and no paresthesia  Additional Notes Patient identified. Risks/Benefits/Options discussed with patient including but not limited to bleeding, infection, nerve damage, paralysis, failed block, incomplete pain control, headache, blood pressure changes, nausea, vomiting, reactions to medication both or allergic, itching and postpartum back pain. Confirmed with bedside nurse the patient's most recent platelet count. Confirmed with patient that they are not currently taking any anticoagulation, have any bleeding history or any family history of bleeding disorders. Patient expressed understanding and wished to proceed. All questions were answered. Sterile technique was used throughout the entire procedure. Please see nursing notes for vital signs. Test dose was given through epidural needle and negative prior to continuing to dose epidural or start infusion. Warning signs of high block given to the patient including shortness of breath, tingling/numbness in hands, complete motor block, or any concerning symptoms with instructions to call for help. Patient was given  instructions on fall risk and not to get out of bed. All questions and concerns addressed with instructions to call with any issues.     

## 2017-11-12 NOTE — Anesthesia Preprocedure Evaluation (Signed)

## 2017-11-13 LAB — CBC
HEMATOCRIT: 34.2 % — AB (ref 36.0–46.0)
HEMOGLOBIN: 11.5 g/dL — AB (ref 12.0–15.0)
MCH: 30.8 pg (ref 26.0–34.0)
MCHC: 33.6 g/dL (ref 30.0–36.0)
MCV: 91.7 fL (ref 78.0–100.0)
Platelets: 281 10*3/uL (ref 150–400)
RBC: 3.73 MIL/uL — ABNORMAL LOW (ref 3.87–5.11)
RDW: 14.2 % (ref 11.5–15.5)
WBC: 27.4 10*3/uL — AB (ref 4.0–10.5)

## 2017-11-13 MED ORDER — SODIUM CHLORIDE 0.9 % IV SOLN
2.0000 g | Freq: Once | INTRAVENOUS | Status: AC
  Administered 2017-11-13: 2 g via INTRAVENOUS
  Filled 2017-11-13: qty 2

## 2017-11-13 MED ORDER — GENTAMICIN SULFATE 40 MG/ML IJ SOLN
1.5000 mg/kg | Freq: Once | INTRAVENOUS | Status: AC
  Administered 2017-11-13: 120 mg via INTRAVENOUS
  Filled 2017-11-13: qty 3

## 2017-11-13 NOTE — Progress Notes (Signed)
Started mother on #20 NS. Baby was having a difficult time getting on deep enough and mother was starting to get sore. DEBP also initiated and educated on setup and storage. Patient demonstrated appropriate use and was still pumping when RN left the room.

## 2017-11-13 NOTE — Lactation Note (Signed)
This note was copied from a baby's chart. Lactation Consultation Note  Patient Name: Brianna Bell Date: 11/13/2017 Reason for consult: Initial assessment;Term;Infant weight loss;Difficult latch;Other (Comment)(RN started the Nipple Shield #20 today due  sore nipples and discomfort / see LC  plan )  Baby is 81 hours old,  And  Initially started out with latching STS , and due to areola swelling and semi flat nipples, and mom having soreness with latching ( per [facility] -  Chloe Winecoff - started a #20 NS ) with success, and milk in the NS afterwards.  As LC entered baby awake and sucking on his fingers.  LC offered to assist/ checked diaper, noted to be dry / placed baby in laid back to the right breast / mom applied  The #20 NS/ and baby opened wide and latched/ LC assisted to ease baby's chin down to obtain the depth. Swallows  Noted and increased with breast compressions. Mom and baby comfortable. Baby STS.   LC Plan and recommendation/ using the NS to latch  Breast shells between feedings,alternating with comfort gels  EBM to nipples liberally  Prior to latch - breast massage, hand express, pre-pump with hand pump to make the  Nipple/ areola complex more elastic for the Nipple Shield to better.  Apply #20 NS , and if EBM available instill into the Nipple Shield for appetizer.  Latch with firm support, have dad ease chin down if needed.  Feed 1st breast, and offer the 2nd at each feeding.  Post pump both breast for 15 -20 mins , save milk for next feeding.   Per mom will have a DEBP at home.  Mother informed of post-discharge support and given phone number to the lactation department, including services for phone call assistance; out-patient appointments; and breastfeeding support group. List of other breastfeeding resources in the community given in the handout. Encouraged mother to call for problems or concerns related to breastfeeding.   Maternal Data Has patient been  taught Hand Expression?: Yes(LC reviewed , and mom returned demo ) Does the patient have breastfeeding experience prior to this delivery?: Yes  Feeding Feeding Type: Breast Fed Length of feed: (still feeding at 12 mins with swallows , increased w/ compressions )  LATCH Score Latch: Grasps breast easily, tongue down, lips flanged, rhythmical sucking.  Audible Swallowing: Spontaneous and intermittent  Type of Nipple: Everted at rest and after stimulation(with #20 NS )  Comfort (Breast/Nipple): Soft / non-tender  Hold (Positioning): Assistance needed to correctly position infant at breast and maintain latch.  LATCH Score: 9  Interventions Interventions: Breast feeding basics reviewed;Assisted with latch;Skin to skin;Breast massage;Hand express;Breast compression;Adjust position;Support pillows;Position options;Shells;Comfort gels;Hand pump  Lactation Tools Discussed/Used Tools: Shells;Pump;Nipple Shields;Comfort gels Nipple shield size: 20;Other (comment)(LC checked size and the #20 fits wel for mom and baby ) Shell Type: Inverted Breast pump type: Manual(RN to set up the DEBP for post  pumping )   Consult Status Consult Status: Follow-up Date: 11/14/17 Follow-up type: In-patient    Brianna Bell 11/13/2017, 4:51 PM

## 2017-11-13 NOTE — Addendum Note (Signed)
Addendum  created 11/13/17 0759 by Shanon Payor, CRNA   Sign clinical note

## 2017-11-13 NOTE — Progress Notes (Signed)
Subjective: Postpartum Day 1: Cesarean Delivery Patient reports tolerating PO and no problems voiding.    Objective: Vital signs in last 24 hours: Temp:  [97.9 F (36.6 C)-101 F (38.3 C)] 98.6 F (37 C) (05/11 0750) Pulse Rate:  [67-123] 69 (05/11 0750) Resp:  [14-22] 18 (05/11 0750) BP: (70-126)/(43-80) 104/58 (05/11 0750) SpO2:  [96 %-98 %] 98 % (05/11 0750)  Physical Exam:  General: alert, cooperative and appears stated age Lochia: appropriate Uterine Fundus: firm Incision: healing well, no dehiscence, stained dressing. DVT Evaluation: No evidence of DVT seen on physical exam. Negative Homan's sign. No cords or calf tenderness.  Recent Labs    11/12/17 0605 11/13/17 0601  HGB 12.9 11.5*  HCT 37.9 34.2*    Assessment/Plan: Status post Cesarean section. Doing well postoperatively.  Continue current care. Replace dressing  Iniko Robles 11/13/2017, 11:03 AM

## 2017-11-13 NOTE — Anesthesia Postprocedure Evaluation (Signed)
Anesthesia Post Note  Patient: Inis Borneman Milberger  Procedure(s) Performed: CESAREAN SECTION (N/A )     Patient location during evaluation: Mother Baby Anesthesia Type: Epidural Level of consciousness: awake and alert and oriented Pain management: pain level controlled Vital Signs Assessment: post-procedure vital signs reviewed and stable Respiratory status: spontaneous breathing and nonlabored ventilation Cardiovascular status: stable Postop Assessment: no headache, no backache, patient able to bend at knees, no signs of nausea or vomiting, adequate PO intake and epidural receding Anesthetic complications: no    Last Vitals:  Vitals:   11/13/17 0350 11/13/17 0750  BP: 102/61 (!) 104/58  Pulse: 68 69  Resp: 18 18  Temp: 36.6 C 37 C  SpO2: 96% 98%    Last Pain:  Vitals:   11/13/17 0620  TempSrc:   PainSc: 0-No pain   Pain Goal:                 Madison Hickman

## 2017-11-13 NOTE — Anesthesia Postprocedure Evaluation (Signed)
Anesthesia Post Note  Patient: Brianna Bell  Procedure(s) Performed: CESAREAN SECTION (N/A )     Patient location during evaluation: PACU Anesthesia Type: Epidural Level of consciousness: awake and alert, oriented and patient cooperative Pain management: pain level controlled Vital Signs Assessment: post-procedure vital signs reviewed and stable Respiratory status: spontaneous breathing, nonlabored ventilation and respiratory function stable Cardiovascular status: blood pressure returned to baseline and stable Postop Assessment: epidural receding, patient able to bend at knees and no apparent nausea or vomiting Anesthetic complications: no    Last Vitals:  Vitals:   11/12/17 2327 11/13/17 0100  BP: 126/80 105/69  Pulse: 71 75  Resp: 17 16  Temp: 37.2 C 37.4 C  SpO2: 97% 96%    Last Pain:  Vitals:   11/12/17 2327  TempSrc: Oral  PainSc: 0-No pain   Pain Goal:                 Torry Adamczak,E. Rui Wordell

## 2017-11-14 ENCOUNTER — Encounter (HOSPITAL_COMMUNITY): Payer: Self-pay | Admitting: Obstetrics & Gynecology

## 2017-11-14 NOTE — Progress Notes (Signed)
Subjective: Postpartum Day 2: Cesarean Delivery Patient reports tolerating PO, + flatus and no problems voiding.  Desires circ.   Objective: Vital signs in last 24 hours: Temp:  [97.7 F (36.5 C)-98.3 F (36.8 C)] 97.7 F (36.5 C) (05/12 0516) Pulse Rate:  [68-98] 68 (05/12 0516) Resp:  [18] 18 (05/12 0516) BP: (94-109)/(54-68) 104/67 (05/12 0516) SpO2:  [95 %-99 %] 99 % (05/12 0516)  Physical Exam:  General: alert, cooperative and appears stated age Lochia: appropriate Uterine Fundus: firm Incision: healing well, no significant drainage, no dehiscence DVT Evaluation: No evidence of DVT seen on physical exam. Negative Homan's sign. No cords or calf tenderness.  Recent Labs    11/12/17 0605 11/13/17 0601  HGB 12.9 11.5*  HCT 37.9 34.2*    Assessment/Plan: Status post Cesarean section. Doing well postoperatively.  Continue current care. Circ-patient counseled re: risk of bleeding, infection, and scarring.  All questions were answered.  Karys Meckley 11/14/2017, 9:55 AM

## 2017-11-15 ENCOUNTER — Encounter (HOSPITAL_COMMUNITY): Payer: Self-pay | Admitting: *Deleted

## 2017-11-15 MED ORDER — OXYCODONE-ACETAMINOPHEN 5-325 MG PO TABS: 1.0000 | ORAL_TABLET | ORAL | 0 refills | Status: DC | PRN

## 2017-11-15 MED ORDER — MEASLES, MUMPS & RUBELLA VAC ~~LOC~~ INJ
0.5000 mL | INJECTION | Freq: Once | SUBCUTANEOUS | Status: AC
  Administered 2017-11-15: 0.5 mL via SUBCUTANEOUS
  Filled 2017-11-15 (×2): qty 0.5

## 2017-11-15 MED ORDER — IBUPROFEN 600 MG PO TABS: 600.0000 mg | ORAL_TABLET | Freq: Four times a day (QID) | ORAL | 0 refills | Status: DC

## 2017-11-15 NOTE — Discharge Summary (Signed)
Obstetric Discharge Summary Reason for Admission: rupture of membranes Prenatal Procedures: none Intrapartum Procedures: Cesarean Section Postpartum Procedures: none Complications-Operative and Postpartum: none Hemoglobin  Date Value Ref Range Status  11/13/2017 11.5 (L) 12.0 - 15.0 g/dL Final   HCT  Date Value Ref Range Status  11/13/2017 34.2 (L) 36.0 - 46.0 % Final    Physical Exam:  General: alert, cooperative and appears stated age 32: appropriate Uterine Fundus: firm Incision: healing well, no significant drainage, no dehiscence, no significant erythema DVT Evaluation: No evidence of DVT seen on physical exam.  Discharge Diagnoses: Term Pregnancy-delivered  Discharge Information: Date: 11/15/2017 Activity: pelvic rest Diet: routine Medications: Ibuprofen and Percocet Condition: improved Instructions: refer to practice specific booklet Discharge to: home   Newborn Data: Live born female  Birth Weight: 7 lb 6.7 oz (3365 g) APGAR: 8, 9  Newborn Delivery   Birth date/time:  11/12/2017 21:28:00 Delivery type:  C-Section, Low Transverse Trial of labor:  Yes C-section categorization:  Primary     Home with mother.  Brianna Bell L 11/15/2017, 7:43 AM

## 2017-11-15 NOTE — Lactation Note (Signed)
This note was copied from a baby's chart. Lactation Consultation Note  Patient Name: Boy Tarae Wooden [id number] Date: 11/15/2017 Reason for consult: Follow-up assessment Mom's milk is coming in.  Breasts full.  Reviewed engorgement prevention and treatment.  Observed baby latched well to breast using a 20 mm nipple shield.  Baby on deep and suckling actively.  Good swallows observed and milk in nipple shield after feeds.  Discharge teaching done.  Mom has a DEBP at home.  Stools are transitional.  Lactation outpatient services and support reviewed and encouraged.  Maternal Data    Feeding Feeding Type: Breast Fed Length of feed: 25 min  LATCH Score Latch: Grasps breast easily, tongue down, lips flanged, rhythmical sucking.  Audible Swallowing: Spontaneous and intermittent  Type of Nipple: Everted at rest and after stimulation  Comfort (Breast/Nipple): Soft / non-tender  Hold (Positioning): No assistance needed to correctly position infant at breast.  LATCH Score: 10  Interventions    Lactation Tools Discussed/Used     Consult Status Consult Status: Complete Follow-up type: Call as needed    Huston Foley 11/15/2017, 8:57 AM

## 2018-02-08 DIAGNOSIS — Z30431 Encounter for routine checking of intrauterine contraceptive device: Secondary | ICD-10-CM | POA: Diagnosis not present

## 2018-03-24 DIAGNOSIS — H52222 Regular astigmatism, left eye: Secondary | ICD-10-CM | POA: Diagnosis not present

## 2018-03-24 DIAGNOSIS — H5213 Myopia, bilateral: Secondary | ICD-10-CM | POA: Diagnosis not present

## 2018-05-04 MED FILL — SERTRALINE HCL 25 MG TABLET: 25 | 30 days supply | Qty: 30 | Fill #0

## 2018-05-10 MED FILL — SERTRALINE HCL 50 MG TABLET: 50 | 30 days supply | Qty: 30 | Fill #0

## 2018-05-17 DIAGNOSIS — L0591 Pilonidal cyst without abscess: Secondary | ICD-10-CM | POA: Diagnosis not present

## 2018-05-17 MED FILL — AMOX-CLAV 875-125 MG TABLET: 875-125 | 7 days supply | Qty: 14 | Fill #0

## 2018-05-30 MED FILL — AMOXICILLIN 500 MG CAPSULE: 500 | 7 days supply | Qty: 21 | Fill #0

## 2018-06-01 DIAGNOSIS — L301 Dyshidrosis [pompholyx]: Secondary | ICD-10-CM | POA: Diagnosis not present

## 2018-07-11 DIAGNOSIS — M533 Sacrococcygeal disorders, not elsewhere classified: Secondary | ICD-10-CM | POA: Diagnosis not present

## 2018-08-17 DIAGNOSIS — J101 Influenza due to other identified influenza virus with other respiratory manifestations: Secondary | ICD-10-CM | POA: Diagnosis not present

## 2018-08-17 DIAGNOSIS — R509 Fever, unspecified: Secondary | ICD-10-CM | POA: Diagnosis not present

## 2018-08-17 MED FILL — XOFLUZA 20 (2) MG TBPK: 2 X 20 | 1 days supply | Qty: 2 | Fill #0

## 2018-08-30 MED FILL — AZITHROMYCIN 250 MG TABLET: 250 | 5 days supply | Qty: 6 | Fill #0

## 2019-01-03 DIAGNOSIS — Z30432 Encounter for removal of intrauterine contraceptive device: Secondary | ICD-10-CM | POA: Diagnosis not present

## 2019-01-03 DIAGNOSIS — Z01419 Encounter for gynecological examination (general) (routine) without abnormal findings: Secondary | ICD-10-CM | POA: Diagnosis not present

## 2019-01-03 DIAGNOSIS — Z6826 Body mass index (BMI) 26.0-26.9, adult: Secondary | ICD-10-CM | POA: Diagnosis not present

## 2019-03-06 MED FILL — ALLOPURINOL 100 MG TABS: 100 | 30 days supply | Qty: 30 | Fill #0

## 2019-03-06 MED FILL — SERTRALINE HCL 25 MG TABLET: 25 | 30 days supply | Qty: 30 | Fill #0

## 2019-03-08 MED FILL — SERTRALINE HCL 50 MG TABLET: 50 | 30 days supply | Qty: 30 | Fill #0

## 2019-05-04 MED FILL — SERTRALINE HCL 50 MG TABLET: 50 | 30 days supply | Qty: 30 | Fill #1

## 2019-06-06 DIAGNOSIS — N911 Secondary amenorrhea: Secondary | ICD-10-CM | POA: Diagnosis not present

## 2019-06-14 DIAGNOSIS — Z3400 Encounter for supervision of normal first pregnancy, unspecified trimester: Secondary | ICD-10-CM | POA: Diagnosis not present

## 2019-06-14 DIAGNOSIS — Z3685 Encounter for antenatal screening for Streptococcus B: Secondary | ICD-10-CM | POA: Diagnosis not present

## 2019-06-14 DIAGNOSIS — Z3481 Encounter for supervision of other normal pregnancy, first trimester: Secondary | ICD-10-CM | POA: Diagnosis not present

## 2019-06-19 MED FILL — SERTRALINE HCL 50 MG TABS: 50 | 30 days supply | Qty: 30 | Fill #2

## 2019-06-21 MED FILL — clonazePAM 1 MG TABS: 1 | 30 days supply | Qty: 60 | Fill #0

## 2019-06-26 ENCOUNTER — Encounter (HOSPITAL_BASED_OUTPATIENT_CLINIC_OR_DEPARTMENT_OTHER): Payer: Self-pay | Admitting: Obstetrics & Gynecology

## 2019-06-26 ENCOUNTER — Other Ambulatory Visit: Payer: Self-pay

## 2019-06-26 ENCOUNTER — Other Ambulatory Visit (HOSPITAL_COMMUNITY)
Admission: RE | Admit: 2019-06-26 | Discharge: 2019-06-26 | Disposition: A | Discharge status: Home / Self Care | Payer: 59 | Source: Ambulatory Visit | Attending: Obstetrics & Gynecology | Admitting: Obstetrics & Gynecology

## 2019-06-26 DIAGNOSIS — O34211 Maternal care for low transverse scar from previous cesarean delivery: Secondary | ICD-10-CM | POA: Diagnosis not present

## 2019-06-26 DIAGNOSIS — Z3689 Encounter for other specified antenatal screening: Secondary | ICD-10-CM | POA: Diagnosis not present

## 2019-06-26 DIAGNOSIS — Z20828 Contact with and (suspected) exposure to other viral communicable diseases: Secondary | ICD-10-CM | POA: Diagnosis not present

## 2019-06-26 DIAGNOSIS — O351XX0 Maternal care for (suspected) chromosomal abnormality in fetus, not applicable or unspecified: Secondary | ICD-10-CM | POA: Diagnosis not present

## 2019-06-26 DIAGNOSIS — O3680X0 Pregnancy with inconclusive fetal viability, not applicable or unspecified: Secondary | ICD-10-CM | POA: Diagnosis not present

## 2019-06-26 LAB — SARS CORONAVIRUS 2 (TAT 6-24 HRS): SARS Coronavirus 2: NEGATIVE

## 2019-06-26 NOTE — H&P (Signed)
Brianna Bell is an 33 y.o. female G 2P1011 with missed ab diagnosed at 10 weeks by MFM; high risk monosomy X on NIPS.  Patient is blood type O pos.  Pertinent Gynecological History: Menses: n/a Bleeding: n/a Contraception: none DES exposure: unknown Blood transfusions: none Sexually transmitted diseases: no past history Previous GYN Procedures: C/S  Last mammogram: n/a Date: n/a  Last pap: normal Date: 01/03/19  OB History: G2, P1011   Menstrual History: Menarche age: n/a Patient's last menstrual period was 04/10/2019 (exact date).    Past Medical History:  Diagnosis Date  . Anxiety     Past Surgical History:  Procedure Laterality Date  . CESAREAN SECTION N/A 11/12/2017   Procedure: CESAREAN SECTION;  Surgeon: Linda Hedges, DO;  Location: [facility];  Service: Obstetrics;  Laterality: N/A;  . DILATION AND EVACUATION N/A 04/23/2017   Procedure: DILATATION AND EVACUATION with ultrasound guidance;  Surgeon: Dian Queen, MD;  Location: [facility];  Service: Gynecology;  Laterality: N/A;    History reviewed. No pertinent family history.  Social History:  reports that she has never smoked. She has never used smokeless tobacco. She reports previous alcohol use. She reports that she does not use drugs.  Allergies: No Known Allergies  No medications prior to admission.    Review of Systems  Height 5\' 4"  (1.626 m), weight 69.4 kg, last menstrual period 04/10/2019, not currently breastfeeding. Physical Exam  Constitutional: She is oriented to person, place, and time. She appears well-developed and well-nourished.  GI: Soft. There is no rebound and no guarding.  Neurological: She is alert and oriented to person, place, and time.  Skin: Skin is warm and dry.  Psychiatric: She has a normal mood and affect. Her behavior is normal.    No results found for this or any previous visit (from the past 24 hour(s)).  No results found.  Assessment/Plan: 33yo with missed  abortion -Suction D&E -Patient has been counseled re: risk of bleeding, infection, scarring, and damage to surrounding structures.  All questions were answered and patient wishes to proceed.  Linda Hedges 06/26/2019, 2:08 PM

## 2019-06-27 ENCOUNTER — Ambulatory Visit (HOSPITAL_BASED_OUTPATIENT_CLINIC_OR_DEPARTMENT_OTHER): Payer: 59 | Admitting: Anesthesiology

## 2019-06-27 ENCOUNTER — Encounter (HOSPITAL_BASED_OUTPATIENT_CLINIC_OR_DEPARTMENT_OTHER)
Admission: RE | Disposition: A | Discharge status: Home / Self Care | Payer: Self-pay | Source: Home / Self Care | Attending: Obstetrics & Gynecology

## 2019-06-27 ENCOUNTER — Ambulatory Visit (HOSPITAL_BASED_OUTPATIENT_CLINIC_OR_DEPARTMENT_OTHER)
Admission: RE | Admit: 2019-06-27 | Discharge: 2019-06-27 | Disposition: A | Discharge status: Home / Self Care | Payer: 59 | Attending: Obstetrics & Gynecology | Admitting: Obstetrics & Gynecology

## 2019-06-27 DIAGNOSIS — F419 Anxiety disorder, unspecified: Secondary | ICD-10-CM | POA: Insufficient documentation

## 2019-06-27 DIAGNOSIS — O021 Missed abortion: Secondary | ICD-10-CM | POA: Diagnosis not present

## 2019-06-27 DIAGNOSIS — Z3A10 10 weeks gestation of pregnancy: Secondary | ICD-10-CM | POA: Diagnosis not present

## 2019-06-27 HISTORY — PX: DILATION AND EVACUATION: SHX1459

## 2019-06-27 HISTORY — DX: Anxiety disorder, unspecified: F41.9

## 2019-06-27 LAB — POCT HEMOGLOBIN-HEMACUE: Hemoglobin: 13.8 g/dL (ref 12.0–15.0)

## 2019-06-27 SURGERY — DILATION AND EVACUATION, UTERUS
Anesthesia: General | Site: Uterus

## 2019-06-27 MED ORDER — PROPOFOL 10 MG/ML IV BOLUS
INTRAVENOUS | Status: AC
  Filled 2019-06-27: qty 20

## 2019-06-27 MED ORDER — SODIUM CHLORIDE 0.9 % IV SOLN
INTRAVENOUS | Status: AC
  Filled 2019-06-27: qty 100

## 2019-06-27 MED ORDER — MIDAZOLAM HCL 2 MG/2ML IJ SOLN
INTRAMUSCULAR | Status: DC | PRN
  Administered 2019-06-27: 2 mg via INTRAVENOUS

## 2019-06-27 MED ORDER — FENTANYL CITRATE (PF) 100 MCG/2ML IJ SOLN
INTRAMUSCULAR | Status: AC
  Filled 2019-06-27: qty 2

## 2019-06-27 MED ORDER — LACTATED RINGERS IV SOLN: INTRAVENOUS | Status: DC

## 2019-06-27 MED ORDER — PHENYLEPHRINE HCL (PRESSORS) 10 MG/ML IV SOLN
INTRAVENOUS | Status: DC | PRN
  Administered 2019-06-27: 200 ug via INTRAVENOUS

## 2019-06-27 MED ORDER — OXYTOCIN 10 UNIT/ML IJ SOLN
INTRAMUSCULAR | Status: AC
  Filled 2019-06-27: qty 1

## 2019-06-27 MED ORDER — METHYLERGONOVINE MALEATE 0.2 MG PO TABS
ORAL_TABLET | ORAL | Status: AC
  Filled 2019-06-27: qty 1

## 2019-06-27 MED ORDER — SODIUM CHLORIDE 0.9 % IV SOLN: 100.0000 mg | Freq: Once | INTRAVENOUS | Status: DC

## 2019-06-27 MED ORDER — DOXYCYCLINE HYCLATE 100 MG PO TABS: 200.0000 mg | ORAL_TABLET | Freq: Once | ORAL | Status: DC

## 2019-06-27 MED ORDER — BUPIVACAINE HCL (PF) 0.5 % IJ SOLN
INTRAMUSCULAR | Status: AC
  Filled 2019-06-27: qty 30

## 2019-06-27 MED ORDER — IBUPROFEN 800 MG PO TABS: 800.0000 mg | ORAL_TABLET | Freq: Four times a day (QID) | ORAL | 0 refills | Status: DC | PRN

## 2019-06-27 MED ORDER — SILVER NITRATE-POT NITRATE 75-25 % EX MISC
CUTANEOUS | Status: AC
  Filled 2019-06-27: qty 10

## 2019-06-27 MED ORDER — CEFAZOLIN SODIUM-DEXTROSE 2-3 GM-%(50ML) IV SOLR
INTRAVENOUS | Status: DC | PRN
  Administered 2019-06-27: 2 g via INTRAVENOUS

## 2019-06-27 MED ORDER — OXYCODONE-ACETAMINOPHEN 5-325 MG PO TABS: 1.0000 | ORAL_TABLET | ORAL | 0 refills | Status: DC | PRN

## 2019-06-27 MED ORDER — KETOROLAC TROMETHAMINE 30 MG/ML IJ SOLN
INTRAMUSCULAR | Status: DC | PRN
  Administered 2019-06-27: 30 mg via INTRAVENOUS

## 2019-06-27 MED ORDER — DEXAMETHASONE SODIUM PHOSPHATE 10 MG/ML IJ SOLN
INTRAMUSCULAR | Status: DC | PRN
  Administered 2019-06-27: 10 mg via INTRAVENOUS

## 2019-06-27 MED ORDER — LIDOCAINE 2% (20 MG/ML) 5 ML SYRINGE
INTRAMUSCULAR | Status: AC
  Filled 2019-06-27: qty 5

## 2019-06-27 MED ORDER — MIDAZOLAM HCL 2 MG/2ML IJ SOLN
INTRAMUSCULAR | Status: AC
  Filled 2019-06-27: qty 2

## 2019-06-27 MED ORDER — LACTATED RINGERS IV SOLN: INTRAVENOUS | Status: DC | PRN

## 2019-06-27 MED ORDER — ONDANSETRON HCL 4 MG/2ML IJ SOLN
INTRAMUSCULAR | Status: DC | PRN
  Administered 2019-06-27: 4 mg via INTRAVENOUS

## 2019-06-27 MED ORDER — LIDOCAINE HCL (PF) 1 % IJ SOLN
INTRAMUSCULAR | Status: AC
  Filled 2019-06-27: qty 30

## 2019-06-27 MED ORDER — FENTANYL CITRATE (PF) 100 MCG/2ML IJ SOLN
INTRAMUSCULAR | Status: DC | PRN
  Administered 2019-06-27: 100 ug via INTRAVENOUS

## 2019-06-27 MED ORDER — MISOPROSTOL 200 MCG PO TABS
ORAL_TABLET | ORAL | Status: AC
  Filled 2019-06-27: qty 1

## 2019-06-27 MED ORDER — PROPOFOL 10 MG/ML IV BOLUS
INTRAVENOUS | Status: DC | PRN
  Administered 2019-06-27: 200 mg via INTRAVENOUS

## 2019-06-27 MED ORDER — LIDOCAINE HCL (CARDIAC) PF 100 MG/5ML IV SOSY
PREFILLED_SYRINGE | INTRAVENOUS | Status: DC | PRN
  Administered 2019-06-27: 100 mg via INTRAVENOUS

## 2019-06-27 SURGICAL SUPPLY — 27 items
CATH ROBINSON RED A/P 14FR (CATHETERS) ×1 IMPLANT
COVER WAND RF STERILE (DRAPES) ×1 IMPLANT
FILTER UTR ASPR ASSEMBLY (MISCELLANEOUS) ×2 IMPLANT
GAUZE 4X4 16PLY RFD (DISPOSABLE) ×2 IMPLANT
GLOVE BIO SURGEON STRL SZ 6 (GLOVE) ×2 IMPLANT
GLOVE BIOGEL PI IND STRL 6 (GLOVE) ×1 IMPLANT
GLOVE BIOGEL PI IND STRL 6.5 (GLOVE) IMPLANT
GLOVE BIOGEL PI IND STRL 8 (GLOVE) IMPLANT
GLOVE BIOGEL PI INDICATOR 6 (GLOVE) ×1
GLOVE BIOGEL PI INDICATOR 6.5 (GLOVE) ×1
GLOVE BIOGEL PI INDICATOR 8 (GLOVE) ×1
GLOVE ECLIPSE 6.5 STRL STRAW (GLOVE) ×1 IMPLANT
GOWN STRL REUS W/ TWL LRG LVL3 (GOWN DISPOSABLE) ×1 IMPLANT
GOWN STRL REUS W/TWL LRG LVL3 (GOWN DISPOSABLE) ×2
HOSE CONNECTING 18IN BERKELEY (TUBING) ×2 IMPLANT
KIT BERKELEY 1ST TRIMESTER 3/8 (MISCELLANEOUS) ×2 IMPLANT
PACK VAGINAL MINOR WOMEN LF (CUSTOM PROCEDURE TRAY) ×2 IMPLANT
PAD OB MATERNITY 4.3X12.25 (PERSONAL CARE ITEMS) ×2 IMPLANT
PAD PREP 24X48 CUFFED NSTRL (MISCELLANEOUS) ×2 IMPLANT
SET BERKELEY SUCTION TUBING (SUCTIONS) ×2 IMPLANT
SLEEVE SCD COMPRESS KNEE MED (MISCELLANEOUS) ×2 IMPLANT
TOWEL GREEN STERILE FF (TOWEL DISPOSABLE) ×2 IMPLANT
TRAP TISSUE FILTER (MISCELLANEOUS) ×4 IMPLANT
VACURETTE 10 RIGID CVD (CANNULA) IMPLANT
VACURETTE 7MM CVD STRL WRAP (CANNULA) IMPLANT
VACURETTE 8 RIGID CVD (CANNULA) IMPLANT
VACURETTE 9 RIGID CVD (CANNULA) ×1 IMPLANT

## 2019-06-27 NOTE — Transfer of Care (Signed)
Immediate Anesthesia Transfer of Care Note  Patient: Brianna Bell  Procedure(s) Performed: DILATATION AND EVACUATION (N/A Uterus)  Patient Location: PACU  Anesthesia Type:General  Level of Consciousness: awake, alert  and oriented  Airway & Oxygen Therapy: Patient Spontanous Breathing and Patient connected to face mask oxygen  Post-op Assessment: Report given to RN and Post -op Vital signs reviewed and stable  Post vital signs: Reviewed and stable  Last Vitals:  Vitals Value Taken Time  BP 105/71 06/27/19 1305  Temp    Pulse 78 06/27/19 1306  Resp    SpO2 100 % 06/27/19 1306  Vitals shown include unvalidated device data.  Last Pain: There were no vitals filed for this visit.       Complications: No apparent anesthesia complications

## 2019-06-27 NOTE — Anesthesia Procedure Notes (Signed)
Procedure Name: LMA Insertion Performed by: Bran Aldridge M, CRNA Pre-anesthesia Checklist: Patient identified, Emergency Drugs available, Suction available and Patient being monitored Patient Re-evaluated:Patient Re-evaluated prior to induction Oxygen Delivery Method: Circle system utilized Preoxygenation: Pre-oxygenation with 100% oxygen Induction Type: IV induction Ventilation: Mask ventilation without difficulty LMA: LMA inserted LMA Size: 4.0 Number of attempts: 1 Airway Equipment and Method: Bite block Placement Confirmation: positive ETCO2 Tube secured with: Tape Dental Injury: Teeth and Oropharynx as per pre-operative assessment        

## 2019-06-27 NOTE — Discharge Instructions (Signed)
Call MD for T>100.4, heavy vaginal bleeding, severe abdominal pain or respiratory distress.  Call office to schedule 2 week postop appointment.  Pelvic rest x 2 weeks.    No NSAIDS (Ibuprofen/Advil) until 7pm today.   Post Anesthesia Home Care Instructions  Activity: Get plenty of rest for the remainder of the day. A responsible individual must stay with you for 24 hours following the procedure.  For the next 24 hours, DO NOT: -Drive a car -Paediatric nurse -Drink alcoholic beverages -Take any medication unless instructed by your physician -Make any legal decisions or sign important papers.  Meals: Start with liquid foods such as gelatin or soup. Progress to regular foods as tolerated. Avoid greasy, spicy, heavy foods. If nausea and/or vomiting occur, drink only clear liquids until the nausea and/or vomiting subsides. Call your physician if vomiting continues.  Special Instructions/Symptoms: Your throat may feel dry or sore from the anesthesia or the breathing tube placed in your throat during surgery. If this causes discomfort, gargle with warm salt water. The discomfort should disappear within 24 hours.  If you had a scopolamine patch placed behind your ear for the management of post- operative nausea and/or vomiting:  1. The medication in the patch is effective for 72 hours, after which it should be removed.  Wrap patch in a tissue and discard in the trash. Wash hands thoroughly with soap and water. 2. You may remove the patch earlier than 72 hours if you experience unpleasant side effects which may include dry mouth, dizziness or visual disturbances. 3. Avoid touching the patch. Wash your hands with soap and water after contact with the patch.

## 2019-06-27 NOTE — Anesthesia Preprocedure Evaluation (Addendum)
Anesthesia Evaluation  Patient identified by MRN, date of birth, ID band Patient awake    Reviewed: Allergy & Precautions, NPO status , Patient's Chart, lab work & pertinent test results  Airway Mallampati: II  TM Distance: >3 FB Neck ROM: Full    Dental no notable dental hx. (+) Teeth Intact, Dental Advisory Given   Pulmonary neg pulmonary ROS,    Pulmonary exam normal breath sounds clear to auscultation       Cardiovascular Exercise Tolerance: Good negative cardio ROS Normal cardiovascular exam Rhythm:Regular Rate:Normal     Neuro/Psych negative neurological ROS  negative psych ROS   GI/Hepatic negative GI ROS, Neg liver ROS,   Endo/Other  negative endocrine ROS  Renal/GU negative Renal ROS     Musculoskeletal negative musculoskeletal ROS (+)   Abdominal   Peds  Hematology negative hematology ROS (+)   Anesthesia Other Findings   Reproductive/Obstetrics                            Anesthesia Physical Anesthesia Plan  ASA: II  Anesthesia Plan: General   Post-op Pain Management:    Induction: Intravenous  PONV Risk Score and Plan: Treatment may vary due to age or medical condition, Ondansetron and Dexamethasone  Airway Management Planned: Oral ETT  Additional Equipment: None  Intra-op Plan:   Post-operative Plan:   Informed Consent: I have reviewed the patients History and Physical, chart, labs and discussed the procedure including the risks, benefits and alternatives for the proposed anesthesia with the patient or authorized representative who has indicated his/her understanding and acceptance.     Dental advisory given  Plan Discussed with: CRNA  Anesthesia Plan Comments:        Anesthesia Quick Evaluation

## 2019-06-27 NOTE — Anesthesia Postprocedure Evaluation (Signed)
Anesthesia Post Note  Patient: Brianna Bell  Procedure(s) Performed: DILATATION AND EVACUATION (N/A Uterus)     Patient location during evaluation: PACU Anesthesia Type: General Level of consciousness: awake and alert Pain management: pain level controlled Vital Signs Assessment: post-procedure vital signs reviewed and stable Respiratory status: spontaneous breathing, nonlabored ventilation, respiratory function stable and patient connected to nasal cannula oxygen Cardiovascular status: blood pressure returned to baseline and stable Postop Assessment: no apparent nausea or vomiting Anesthetic complications: no    Last Vitals:  Vitals:   06/27/19 1340 06/27/19 1358  BP: 109/71 111/76  Pulse: 80 72  Resp: 17 16  Temp:  36.7 C  SpO2: 98% 100%    Last Pain:  Vitals:   06/27/19 1358  TempSrc: Oral  PainSc: 0-No pain                 Barnet Glasgow

## 2019-06-27 NOTE — Op Note (Signed)
PREOPERATIVE DIAGNOSIS: 33yo withmissed abortion measuring 9 weeks   POSTOPERATIVE DIAGNOSIS: The same  PROCEDURE: Suction Dilation and Evacuation  SURGEON: Dr. Linda Hedges  INDICATIONS:33y.o. here for scheduled surgery for treatment ofmissed abortion.Risks of surgery were discussed with the patient including but not limited to: bleeding which may require transfusion; infection which may require antibiotics; injury to uterus or surrounding organs; intrauterine scarring which may impair future fertility; need for additional procedures including laparotomy or laparoscopy; and other postoperative/anesthesia complications. Written informed consent was obtained.   FINDINGS: An 10week sized uterus   ANESTHESIA:GETA  ESTIMATED BLOOD LOSS:50cc  SPECIMENS: POC sent to pathology  COMPLICATIONS: None immediate.  PROCEDURE DETAILS: The patient was taken to the operating room where GETAwas administered.After an adequate timeout was performed, she was placed in the dorsal lithotomy position and examined; then prepped and draped in the sterile manner. Her bladder was catheterized for an unmeasured amount of clear, yellow urine. A speculum was then placed in the patient's vagina and a tenaculumwas applied to the anterior lip of the cervix.The uterus was dilated manually with metal dilators to accommodate the64mm suction curette. Once the cervix was dilated, the suction curette was advanced without difficulty. Suction curettage was performed x 3passes.Sharp curettage was performed and a gritty texture was appreciated globally. A final suction curettage was performed. Thetenaculumwas removed from the anterior lip of the cervix.Hemostasis was observed.The vaginal speculum was removed after noting good hemostasis. The patient tolerated the procedure well and was taken to the recovery area awake and in stable condition.

## 2019-06-27 NOTE — Progress Notes (Signed)
No change to H&P.  Patient declines genetic studies.  Linda Hedges, DO

## 2019-06-28 ENCOUNTER — Encounter: Payer: Self-pay | Admitting: *Deleted

## 2019-06-28 LAB — SURGICAL PATHOLOGY

## 2019-07-18 MED FILL — AZITHROMYCIN 250 MG TABS: 250 | 5 days supply | Qty: 6 | Fill #0

## 2019-07-24 MED FILL — SERTRALINE HCL 50 MG TABLET: 50 | 30 days supply | Qty: 30 | Fill #3

## 2019-07-28 MED FILL — AZITHROMYCIN 250 MG TABS: 250 | 5 days supply | Qty: 6 | Fill #0

## 2019-08-09 MED FILL — metroNIDAZOLE 0.75 % GEL: 0.75 | 30 days supply | Qty: 45 | Fill #0

## 2019-12-19 MED FILL — SERTRALINE HCL 50 MG TABLET: 50 | 30 days supply | Qty: 30 | Fill #4

## 2020-06-13 ENCOUNTER — Other Ambulatory Visit (HOSPITAL_COMMUNITY): Payer: Self-pay | Admitting: Radiology

## 2020-06-13 MED FILL — SERTRALINE HCL 50 MG TABLET: 50 | 90 days supply | Qty: 90 | Fill #0

## 2020-07-12 ENCOUNTER — Other Ambulatory Visit (HOSPITAL_COMMUNITY): Payer: Self-pay | Admitting: Obstetrics and Gynecology

## 2020-07-12 MED FILL — AZITHROMYCIN 250 MG TABLET: 250 | 5 days supply | Qty: 6 | Fill #0

## 2020-07-25 ENCOUNTER — Other Ambulatory Visit (HOSPITAL_COMMUNITY): Payer: Self-pay | Admitting: Obstetrics and Gynecology

## 2020-07-25 MED FILL — BUTALB-ACETAMIN-CAFF 50-300: 50-300-40 | 5 days supply | Qty: 30 | Fill #0

## 2020-08-01 DIAGNOSIS — Z3682 Encounter for antenatal screening for nuchal translucency: Secondary | ICD-10-CM | POA: Diagnosis not present

## 2020-08-15 DIAGNOSIS — Z3A18 18 weeks gestation of pregnancy: Secondary | ICD-10-CM | POA: Diagnosis not present

## 2020-08-15 DIAGNOSIS — Z3400 Encounter for supervision of normal first pregnancy, unspecified trimester: Secondary | ICD-10-CM | POA: Diagnosis not present

## 2020-08-15 DIAGNOSIS — Z363 Encounter for antenatal screening for malformations: Secondary | ICD-10-CM | POA: Diagnosis not present

## 2020-10-10 DIAGNOSIS — Z3480 Encounter for supervision of other normal pregnancy, unspecified trimester: Secondary | ICD-10-CM | POA: Diagnosis not present

## 2020-10-10 DIAGNOSIS — Z23 Encounter for immunization: Secondary | ICD-10-CM | POA: Diagnosis not present

## 2020-10-10 DIAGNOSIS — Z3400 Encounter for supervision of normal first pregnancy, unspecified trimester: Secondary | ICD-10-CM | POA: Diagnosis not present

## 2020-10-21 ENCOUNTER — Other Ambulatory Visit (HOSPITAL_COMMUNITY): Payer: Self-pay

## 2020-10-21 MED ORDER — AMOXICILLIN-POT CLAVULANATE 875-125 MG PO TABS
1.0000 | ORAL_TABLET | Freq: Two times a day (BID) | ORAL | 0 refills | Status: AC
  Filled 2020-10-21: qty 14, 7d supply, fill #0

## 2020-10-21 MED ORDER — METHYLPREDNISOLONE 4 MG PO TBPK
ORAL_TABLET | ORAL | 0 refills | Status: DC
  Filled 2020-10-21: qty 21, 6d supply, fill #0

## 2020-10-21 MED FILL — Sertraline HCl Tab 50 MG: ORAL | 90 days supply | Qty: 90 | Fill #0 | Status: CN

## 2020-10-22 ENCOUNTER — Other Ambulatory Visit (HOSPITAL_COMMUNITY): Payer: Self-pay

## 2020-10-31 ENCOUNTER — Other Ambulatory Visit (HOSPITAL_COMMUNITY): Payer: Self-pay

## 2020-11-11 ENCOUNTER — Other Ambulatory Visit (HOSPITAL_COMMUNITY): Payer: Self-pay

## 2020-11-11 MED FILL — Sertraline HCl Tab 50 MG: ORAL | 90 days supply | Qty: 90 | Fill #0 | Status: AC

## 2020-12-09 ENCOUNTER — Other Ambulatory Visit (HOSPITAL_COMMUNITY): Payer: Self-pay

## 2020-12-09 DIAGNOSIS — O26893 Other specified pregnancy related conditions, third trimester: Secondary | ICD-10-CM | POA: Diagnosis not present

## 2020-12-09 DIAGNOSIS — Z3A35 35 weeks gestation of pregnancy: Secondary | ICD-10-CM | POA: Diagnosis not present

## 2020-12-09 DIAGNOSIS — Z3483 Encounter for supervision of other normal pregnancy, third trimester: Secondary | ICD-10-CM | POA: Diagnosis not present

## 2020-12-09 DIAGNOSIS — R102 Pelvic and perineal pain: Secondary | ICD-10-CM | POA: Diagnosis not present

## 2020-12-09 MED ORDER — CYCLOBENZAPRINE HCL 10 MG PO TABS
10.0000 mg | ORAL_TABLET | Freq: Three times a day (TID) | ORAL | 0 refills | Status: DC
  Filled 2020-12-09: qty 21, 7d supply, fill #0

## 2020-12-10 ENCOUNTER — Inpatient Hospital Stay (HOSPITAL_COMMUNITY)
Admission: AD | Admit: 2020-12-10 | Discharge: 2020-12-10 | Disposition: A | Discharge status: Home / Self Care | Payer: 59 | Attending: Obstetrics & Gynecology | Admitting: Obstetrics & Gynecology

## 2020-12-10 ENCOUNTER — Other Ambulatory Visit: Payer: Self-pay

## 2020-12-10 ENCOUNTER — Encounter (HOSPITAL_COMMUNITY): Payer: Self-pay | Admitting: Obstetrics & Gynecology

## 2020-12-10 DIAGNOSIS — O26893 Other specified pregnancy related conditions, third trimester: Secondary | ICD-10-CM | POA: Diagnosis present

## 2020-12-10 DIAGNOSIS — R52 Pain, unspecified: Secondary | ICD-10-CM | POA: Diagnosis not present

## 2020-12-10 DIAGNOSIS — R1084 Generalized abdominal pain: Secondary | ICD-10-CM | POA: Insufficient documentation

## 2020-12-10 DIAGNOSIS — F419 Anxiety disorder, unspecified: Secondary | ICD-10-CM | POA: Diagnosis not present

## 2020-12-10 DIAGNOSIS — Z20822 Contact with and (suspected) exposure to covid-19: Secondary | ICD-10-CM | POA: Diagnosis not present

## 2020-12-10 DIAGNOSIS — Z79899 Other long term (current) drug therapy: Secondary | ICD-10-CM | POA: Insufficient documentation

## 2020-12-10 DIAGNOSIS — Z3689 Encounter for other specified antenatal screening: Secondary | ICD-10-CM

## 2020-12-10 DIAGNOSIS — O99343 Other mental disorders complicating pregnancy, third trimester: Secondary | ICD-10-CM | POA: Insufficient documentation

## 2020-12-10 DIAGNOSIS — Z3A35 35 weeks gestation of pregnancy: Secondary | ICD-10-CM | POA: Diagnosis not present

## 2020-12-10 DIAGNOSIS — O99891 Other specified diseases and conditions complicating pregnancy: Secondary | ICD-10-CM | POA: Diagnosis not present

## 2020-12-10 LAB — COMPREHENSIVE METABOLIC PANEL
ALT: 14 U/L (ref 0–44)
AST: 19 U/L (ref 15–41)
Albumin: 2.4 g/dL — ABNORMAL LOW (ref 3.5–5.0)
Alkaline Phosphatase: 99 U/L (ref 38–126)
Anion gap: 6 (ref 5–15)
BUN: 6 mg/dL (ref 6–20)
CO2: 23 mmol/L (ref 22–32)
Calcium: 8.6 mg/dL — ABNORMAL LOW (ref 8.9–10.3)
Chloride: 104 mmol/L (ref 98–111)
Creatinine, Ser: 0.61 mg/dL (ref 0.44–1.00)
GFR, Estimated: >60 mL/min (ref >60)
Glucose, Bld: 104 mg/dL — ABNORMAL HIGH (ref 70–99)
Potassium: 3.8 mmol/L (ref 3.5–5.1)
Sodium: 133 mmol/L — ABNORMAL LOW (ref 135–145)
Total Bilirubin: 0.4 mg/dL (ref 0.3–1.2)
Total Protein: 6 g/dL — ABNORMAL LOW (ref 6.5–8.1)

## 2020-12-10 LAB — CBC WITH DIFFERENTIAL/PLATELET
Abs Immature Granulocytes: 0.06 10*3/uL (ref 0.00–0.07)
Basophils Absolute: 0 10*3/uL (ref 0.0–0.1)
Basophils Relative: 0 %
Eosinophils Absolute: 0 10*3/uL (ref 0.0–0.5)
Eosinophils Relative: 0 %
HCT: 31.9 % — ABNORMAL LOW (ref 36.0–46.0)
Hemoglobin: 10.5 g/dL — ABNORMAL LOW (ref 12.0–15.0)
Immature Granulocytes: 1 %
Lymphocytes Relative: 9 %
Lymphs Abs: 0.9 10*3/uL (ref 0.7–4.0)
MCH: 28 pg (ref 26.0–34.0)
MCHC: 32.9 g/dL (ref 30.0–36.0)
MCV: 85.1 fL (ref 80.0–100.0)
Monocytes Absolute: 1.2 10*3/uL — ABNORMAL HIGH (ref 0.1–1.0)
Monocytes Relative: 12 %
Neutro Abs: 7.9 10*3/uL — ABNORMAL HIGH (ref 1.7–7.7)
Neutrophils Relative %: 78 %
Platelets: 313 10*3/uL (ref 150–400)
RBC: 3.75 MIL/uL — ABNORMAL LOW (ref 3.87–5.11)
RDW: 13.2 % (ref 11.5–15.5)
WBC: 10.1 10*3/uL (ref 4.0–10.5)
nRBC: 0 % (ref 0.0–0.2)

## 2020-12-10 LAB — RESP PANEL BY RT-PCR (FLU A&B, COVID) ARPGX2
Influenza A by PCR: NEGATIVE
Influenza B by PCR: NEGATIVE
SARS Coronavirus 2 by RT PCR: NEGATIVE

## 2020-12-10 LAB — SEDIMENTATION RATE: Sed Rate: 59 mm/hr — ABNORMAL HIGH (ref 0–22)

## 2020-12-10 NOTE — MAU Note (Signed)
Been having pain, like a pulled muscle pain since Sunday.  Started in abd, then to a point on her inner thigh and left underarm.  Pt states has not done anything different.  Had [place] at [facility] yesterday, baby and pregnancy related- everything looked good.  Came in because she was in so much pain last night.  Denies bleeding or ROM, no fever.

## 2020-12-10 NOTE — MAU Provider Note (Signed)
History     CSN: [id number]  Arrival date and time: 12/10/20 1147   Event Date/Time   First Provider Initiated Contact with Patient 12/10/20 1234      Chief Complaint  Patient presents with  . Abdominal Pain  . Leg Pain   [name] is a 35 y.o. G3P1011 at 55w1dwho presents to MAU for widespread pain. Patient reports on Sunday night she woke up and was having generalized abdominal pain that felt like pulled muscles. Patient reports the pain is currently still in her abdomen, but is not also radiating down her right inner thigh and wraps around behind her knee and then to her outer calf and stops halfway down her outer leg. Patient also reports last night she felt a pain under her left armpit as well. Patient also reports there is an area of tenderness on the inside of her right thigh as well. Patient describes the pain as "like a pulled muscle" in her abdomen, and on her right thigh feels like a burning, pulled muscle. Patient reports the pain in her abdomen and right thigh is not present unless she is moving, and she does not have any pain at rest at this time. Patient reports the last time she felt the pain in her abdomen and right leg was walking in to MAU. On the left thigh, it feels bruised, and is only tender when touched. Patient reports the pain in her left armpit also just feels bruised and is only tender when touched as well, otherwise it is not painful.  Patient reports she saw her OB yesterday and told her about these concerns yesterday, and was prescribed Flexeril, which she reports did not work. Patient reports she did have [place] on [place] baby and was told that everything was normal. Patient reports she was not given further instructions if the Flexeril did not work.  Patient reports she does not have a primary care provider.  Pt denies VB, LOF, ctx, decreased FM, vaginal discharge/odor/itching. Pt denies N/V, constipation, diarrhea, or urinary  problems. Pt denies fever, chills, fatigue, sweating or changes in appetite. Pt denies SOB or chest pain. Pt denies dizziness, HA, light-headedness, weakness.  Problems this pregnancy include: none. Allergies? NKDA Current medications/supplements? Sertraline 587mdaily (has been taking for 1 year), PNV Prenatal care provider? [facility], next appt 12/22/2020   OB History    Gravida  3   Para  1   Term  1   Preterm      AB  1   Living  1     SAB      IAB  1   Ectopic      Multiple      Live Births  1           Past Medical History:  Diagnosis Date  . Anxiety     Past Surgical History:  Procedure Laterality Date  . CESAREAN SECTION N/A 11/12/2017   Procedure: CESAREAN SECTION;  Surgeon: MoLinda HedgesDO;  [facility];  Laterality: N/A;  . DILATION AND EVACUATION N/A 04/23/2017   Procedure: DILATATION AND EVACUATION with ultrasound guidance;  Surgeon: GrDian QueenMD;  [facility];  Service: Gynecology;  Laterality: N/A;  . DILATION AND EVACUATION N/A 06/27/2019   Procedure: DILATATION AND EVACUATION;  Surgeon: MoLinda HedgesDO;  [place] [facility] [place];  Laterality: N/A;    History reviewed. No  pertinent family history.  Social History   Tobacco Use  . Smoking status: Never Smoker  . Smokeless tobacco: Never Used  Vaping Use  . Vaping Use: Never used  Substance Use Topics  . Alcohol use: Not Currently    Comment: social  . Drug use: Never    Allergies: No Known Allergies  Medications Prior to Admission  Medication Sig Dispense Refill Last Dose  . cyclobenzaprine (FLEXERIL) 10 MG tablet Take 1 tablet (10 mg total) by mouth 3 (three) times daily. 21 tablet 0 12/09/2020 at Unknown time  . Prenatal Vit-Fe Fumarate-FA (PRENATAL MULTIVITAMIN) TABS tablet Take 1 tablet by mouth daily at 12 noon.   12/09/2020 at Unknown time  . sertraline (ZOLOFT) 50 MG tablet  Take 50 mg by mouth daily.   12/10/2020 at Unknown time  . sertraline (ZOLOFT) 50 MG tablet TAKE 1 TABLET BY MOUTH ONCE DAILY. 90 tablet 4 12/10/2020 at Unknown time  . azithromycin (ZITHROMAX) 250 MG tablet TAKE 2 TABLETS BY MOUTH ON DAY 1 THEN TAKE 1 TABLET DAILY FOR THE NEXT 4 DAYS (Patient taking differently: Take by mouth daily. for the next 4 days) 6 tablet 1   . Butalbital-APAP-Caffeine 50-300-40 MG CAPS TAKE 1 CAPSULE BY MOUTH EVERY 4 HOURS 30 capsule 3 More than a month at Unknown time  . clonazePAM (KLONOPIN) 1 MG tablet Take 1 mg by mouth 2 (two) times daily.     Marland Kitchen ibuprofen (ADVIL) 800 MG tablet Take 1 tablet (800 mg total) by mouth every 6 (six) hours as needed. 30 tablet 0   . methylPREDNISolone (MEDROL DOSEPAK) 4 MG TBPK tablet Take as directed on package 21 each 0   . oxyCODONE-acetaminophen (PERCOCET) 5-325 MG tablet Take 1 tablet by mouth every 4 (four) hours as needed for severe pain. 15 tablet 0     Review of Systems  Constitutional: Negative for chills, diaphoresis, fatigue and fever.  Eyes: Negative for visual disturbance.  Respiratory: Negative for shortness of breath.   Cardiovascular: Negative for chest pain.  Gastrointestinal: Negative for abdominal pain, constipation, diarrhea, nausea and vomiting.  Genitourinary: Negative for dysuria, flank pain, frequency, pelvic pain, urgency, vaginal bleeding and vaginal discharge.  Musculoskeletal: Positive for myalgias.  Neurological: Negative for dizziness, weakness, light-headedness and headaches.   Physical Exam   Blood pressure 120/77, pulse (!) 101, temperature 98.6 F (37 C), temperature source Oral, resp. rate 17, height _0  (1.626 m), weight 78.9 kg, SpO2 99 %.  Patient Vitals for the past 24 hrs:  BP Temp Temp src Pulse Resp SpO2 Height Weight  12/10/20 1232 120/77 -- -- (!) 101 -- -- -- --  12/10/20 1230 -- -- -- -- -- 99 % -- --  12/10/20 1225 -- -- -- -- -- 100 % -- --  12/10/20 1208 118/64 98.6 F (37 C) Oral  (!) 103 17 99 % _1  (1.626 m) 78.9 kg   Physical Exam Vitals and nursing note reviewed.  Constitutional:      General: She is not in acute distress.    Appearance: Normal appearance. She is not ill-appearing, toxic-appearing or diaphoretic.  HENT:     Head: Normocephalic and atraumatic.  Pulmonary:     Effort: Pulmonary effort is normal.  Musculoskeletal:     Comments: Areas of pain per HPI evaluated, no abnormalities or skin changes noted, only tender to palpation across all areas noted by patient.  Skin:    General: Skin is warm and dry.  Neurological:  Mental Status: She is alert and oriented to person, place, and time.  Psychiatric:        Mood and Affect: Mood normal.        Behavior: Behavior normal.        Thought Content: Thought content normal.        Judgment: Judgment normal.    Results for orders placed or performed during the hospital encounter of 12/10/20 (from the past 24 hour(s))  CBC with Differential/Platelet     Status: Abnormal   Collection Time: 12/10/20  1:34 PM  Result Value Ref Range   WBC 10.1 4.0 - 10.5 K/uL   RBC 3.75 (L) 3.87 - 5.11 MIL/uL   Hemoglobin 10.5 (L) 12.0 - 15.0 g/dL   HCT 31.9 (L) 36.0 - 46.0 %   MCV 85.1 80.0 - 100.0 fL   MCH 28.0 26.0 - 34.0 pg   MCHC 32.9 30.0 - 36.0 g/dL   RDW 13.2 11.5 - 15.5 %   Platelets 313 150 - 400 K/uL   nRBC 0.0 0.0 - 0.2 %   Neutrophils Relative % 78 %   Neutro Abs 7.9 (H) 1.7 - 7.7 K/uL   Lymphocytes Relative 9 %   Lymphs Abs 0.9 0.7 - 4.0 K/uL   Monocytes Relative 12 %   Monocytes Absolute 1.2 (H) 0.1 - 1.0 K/uL   Eosinophils Relative 0 %   Eosinophils Absolute 0.0 0.0 - 0.5 K/uL   Basophils Relative 0 %   Basophils Absolute 0.0 0.0 - 0.1 K/uL   Immature Granulocytes 1 %   Abs Immature Granulocytes 0.06 0.00 - 0.07 K/uL  Comprehensive metabolic panel     Status: Abnormal   Collection Time: 12/10/20  1:34 PM  Result Value Ref Range   Sodium 133 (L) 135 - 145 mmol/L   Potassium 3.8 3.5 -  5.1 mmol/L   Chloride 104 98 - 111 mmol/L   CO2 23 22 - 32 mmol/L   Glucose, Bld 104 (H) 70 - 99 mg/dL   BUN 6 6 - 20 mg/dL   Creatinine, Ser 0.61 0.44 - 1.00 mg/dL   Calcium 8.6 (L) 8.9 - 10.3 mg/dL   Total Protein 6.0 (L) 6.5 - 8.1 g/dL   Albumin 2.4 (L) 3.5 - 5.0 g/dL   AST 19 15 - 41 U/L   ALT 14 0 - 44 U/L   Alkaline Phosphatase 99 38 - 126 U/L   Total Bilirubin 0.4 0.3 - 1.2 mg/dL   GFR, Estimated >60 >60 mL/min   Anion gap 6 5 - 15  Sedimentation rate     Status: Abnormal   Collection Time: 12/10/20  1:34 PM  Result Value Ref Range   Sed Rate 59 (H) 0 - 22 mm/hr  Resp Panel by RT-PCR (Flu A&B, Covid) Nasopharyngeal Swab     Status: None   Collection Time: 12/10/20  1:36 PM   Specimen: Nasopharyngeal Swab; Nasopharyngeal(NP) swabs in vial transport medium  Result Value Ref Range   SARS Coronavirus 2 by RT PCR NEGATIVE NEGATIVE   Influenza A by PCR NEGATIVE NEGATIVE   Influenza B by PCR NEGATIVE NEGATIVE    MAU Course  Procedures  MDM -widespread pain per pt report -no abnormalities on exam other than tenderness to palpation -consulted with Dr. Nehemiah Settle who recommends CBC/CMP/ESR/COVID testing in MAU and if all grossly normal, can go home with outpatient rheumatology consult and/or present to the ED after MAU discharge for further evaluation; per Dr. Nehemiah Settle, vascular US and OB  US are not indicated at this time -CBC: no abnormalities requiring treatment -CMP: no abnormalities requiring treatment -ESR: 59 -COVID/FLU: negative -EFM: reactive       -baseline: 140       -variability: moderate       -accels: present, 15x15       -decels: absent       -TOCO: irritability -pt declines pain medication at this time -pt discharged to home in stable condition  Orders Placed This Encounter  Procedures  . Resp Panel by RT-PCR (Flu A&B, Covid) Nasopharyngeal Swab    Standing Status:   Standing    Number of Occurrences:   1    Order Specific Question:   Is this test for  diagnosis or screening    Answer:   Diagnosis of ill patient    Order Specific Question:   Symptomatic for COVID-19 as defined by CDC    Answer:   Yes    Order Specific Question:   Date of Symptom Onset    Answer:   12/09/2020    Order Specific Question:   Hospitalized for COVID-19    Answer:   Yes    Order Specific Question:   Admitted to ICU for COVID-19    Answer:   No    Order Specific Question:   Previously tested for COVID-19    Answer:   No    Order Specific Question:   Resident in a congregate (group) care setting    Answer:   No    Order Specific Question:   Employed in healthcare setting    Answer:   Yes    Order Specific Question:   Pregnant    Answer:   Yes    Order Specific Question:   Has patient completed COVID vaccination(s) (2 doses of Pfizer/Moderna 1 dose of [employer])    Answer:   Unknown  . CBC with Differential/Platelet    Standing Status:   Standing    Number of Occurrences:   1  . Comprehensive metabolic panel    Standing Status:   Standing    Number of Occurrences:   1  . Sedimentation rate    Standing Status:   Standing    Number of Occurrences:   1  . Ambulatory referral to Rheumatology    Referral Priority:   Urgent    Referral Type:   Consultation    Referral Reason:   Specialty Services Required    Requested Specialty:   Rheumatology    Number of Visits Requested:   1  . Airborne and Contact precautions    Standing Status:   Standing    Number of Occurrences:   1  . Discharge patient    Order Specific Question:   Discharge disposition    Answer:   01-Home or Self Care [1]    Order Specific Question:   Discharge patient date    Answer:   12/10/2020   No orders of the defined types were placed in this encounter.  Assessment and Plan   1. Generalized pain   2. [redacted] weeks gestation of pregnancy   3. NST (non-stress test) reactive    Allergies as of 12/10/2020   No Known Allergies     Medication List    STOP taking these medications    ibuprofen 800 MG tablet Commonly known as: ADVIL     TAKE these medications   azithromycin 250 MG tablet Commonly known as: ZITHROMAX TAKE 2 TABLETS BY MOUTH  ON DAY 1 THEN TAKE 1 TABLET DAILY FOR THE NEXT 4 DAYS   Butalbital-APAP-Caffeine 50-300-40 MG Caps TAKE 1 CAPSULE BY MOUTH EVERY 4 HOURS   clonazePAM 1 MG tablet Commonly known as: KLONOPIN Take 1 mg by mouth 2 (two) times daily.   cyclobenzaprine 10 MG tablet Commonly known as: FLEXERIL Take 1 tablet (10 mg total) by mouth 3 (three) times daily.   methylPREDNISolone 4 MG Tbpk tablet Commonly known as: MEDROL DOSEPAK Take as directed on package   oxyCODONE-acetaminophen 5-325 MG tablet Commonly known as: Percocet Take 1 tablet by mouth every 4 (four) hours as needed for severe pain.   prenatal multivitamin Tabs tablet Take 1 tablet by mouth daily at 12 noon.   sertraline 50 MG tablet Commonly known as: ZOLOFT Take 50 mg by mouth daily.   sertraline 50 MG tablet Commonly known as: ZOLOFT TAKE 1 TABLET BY MOUTH ONCE DAILY.      -urgent referral to rheumatology -return MAU precautions given -pt advised to present to ED with worsening pain symptoms and discussed OB RR nurse -pt discharged to home in stable condition  Elmyra Ricks E Alger Kerstein 12/10/2020, 4:03 PM

## 2020-12-10 NOTE — Discharge Instructions (Signed)
Abdominal Pain During Pregnancy Abdominal pain is common during pregnancy and has many possible causes. Some causes are more serious than others, and sometimes the cause is not known. Abdominal pain can be a sign that labor is starting. It can also be caused by normal growth of your baby causing stretching of muscles and ligaments during pregnancy. Always tell your health care provider if you have any abdominal pain. Follow these instructions at home:  Do not have sex or put anything in your vagina until your pain goes away completely.  Get plenty of rest until your pain improves.  Drink enough fluid to keep your urine pale yellow.  Take over-the-counter and prescription medicines only as told by your health care provider.  Keep all follow-up visits. This is important.   Contact a health care provider if:  Your pain continues or gets worse after resting.  You have lower abdominal pain that: ? Comes and goes at regular intervals. ? Spreads to your back. ? Is similar to menstrual cramps.  You have pain or burning when you urinate. Get help right away if:  You have a fever, chills, or shortness of breath.  You have vaginal bleeding.  You are leaking fluid or passing tissue from your vagina.  You have vomiting or diarrhea that lasts for more than 24 hours.  Your baby is moving less than usual.  You feel very weak or faint.  You develop severe pain in your upper abdomen. Summary  Abdominal pain is common during pregnancy and has many possible causes.  If you experience abdominal pain during pregnancy, tell your health care provider right away.  Follow your health care provider's home care instructions and keep all follow-up visits as told. This information is not intended to replace advice given to you by your health care provider. Make sure you discuss any questions you have with your health care provider. Document Revised: 03/05/2020 Document Reviewed: 03/05/2020 Elsevier  Patient Education  2021 [employer].        Preterm Labor The normal length of a pregnancy is 39-41 weeks. Preterm labor is when labor starts before 37 completed weeks of pregnancy. Babies who are born prematurely and survive may not be fully developed and may be at an increased risk for long-term problems such as cerebral palsy, developmental delays, and vision and hearing problems. Babies who are born too early may have problems soon after birth. Problems may include regulating blood sugar, body temperature, heart rate, and breathing rate. These babies often have trouble with feeding. The risk of having problems is highest for babies who are born before 52 weeks of pregnancy. What are the causes? The exact cause of this condition is not known. What increases the risk? You are more likely to have preterm labor if you have certain risk factors that relate to your medical history, problems with present and past pregnancies, and lifestyle factors. Medical history  You have abnormalities of the uterus, including a short cervix.  You have STIs (sexually transmitted infections), or other infections of the urinary tract and the vagina.  You have chronic illnesses, such as blood clotting problems, diabetes, or high blood pressure.  You are overweight or underweight. Present and past pregnancies  You have had preterm labor before.  You are pregnant with twins or other multiples.  You have been diagnosed with a condition in which the placenta covers your cervix (placenta previa).  You waited less than 6 months between giving birth and becoming pregnant again.  Your unborn baby has some abnormalities.  You have vaginal bleeding during pregnancy.  You became pregnant through in vitro fertilization (IVF). Lifestyle and environmental factors  You use tobacco products.  You drink alcohol.  You use street drugs.  You have stress and no social support.  You experience domestic  violence.  You are exposed to certain chemicals or environmental pollutants. Other factors  You are younger than age 7 or older than age 67. What are the signs or symptoms? Symptoms of this condition include:  Cramps similar to those that can happen during a menstrual period. The cramps may happen with diarrhea.  Pain in the abdomen or lower back.  Regular contractions that may feel like tightening of the abdomen.  A feeling of increased pressure in the pelvis.  Increased watery or bloody mucus discharge from the vagina.  Water breaking (ruptured amniotic sac). How is this diagnosed? This condition is diagnosed based on:  Your medical history and a physical exam.  A pelvic exam.  An ultrasound.  Monitoring your uterus for contractions.  Other tests, including: ? A swab of the cervix to check for a chemical called fetal fibronectin. ? Urine tests. How is this treated? Treatment for this condition depends on the length of your pregnancy, your condition, and the health of your baby. Treatment may include:  Taking medicines, such as: ? Hormone medicines. These may be given early in pregnancy to help support the pregnancy. ? Medicines to stop contractions. ? Medicines to help mature the baby's lungs. These may be prescribed if the risk of delivery is high. ? Medicines to prevent your baby from developing cerebral palsy.  Bed rest. If the labor happens before 34 weeks of pregnancy, you may need to stay in the hospital.  Delivery of the baby. Follow these instructions at home:  Do not use any products that contain nicotine or tobacco, such as cigarettes, e-cigarettes, and chewing tobacco. If you need help quitting, ask your health care provider.  Do not drink alcohol.  Take over-the-counter and prescription medicines only as told by your health care provider.  Rest as told by your health care provider.  Return to your normal activities as told by your health care  provider. Ask your health care provider what activities are safe for you.  Keep all follow-up visits as told by your health care provider. This is important.   How is this prevented? To increase your chance of having a full-term pregnancy:  Do not use street drugs or medicines that have not been prescribed to you during your pregnancy.  Talk with your health care provider before taking any herbal supplements, even if you have been taking them regularly.  Make sure you gain a healthy amount of weight during your pregnancy.  Watch for infection. If you think that you might have an infection, get it checked right away. Symptoms of infection may include: ? Fever. ? Abnormal vaginal discharge or discharge that smells bad. ? Pain or burning with urination. ? Needing to urinate urgently. ? Frequently urinating or passing small amounts of urine frequently. ? Blood in your urine. ? Urine that smells bad or unusual.  Tell your health care provider if you have had preterm labor before. Contact a health care provider if:  You think you are going into preterm labor.  You have signs or symptoms of preterm labor.  You have symptoms of infection. Get help right away if:  You are having regular, painful contractions every 5 minutes  or less.  Your water breaks. Summary  Preterm labor is labor that starts before you reach 37 weeks of pregnancy.  Delivering your baby early increases your baby's risk of developing lifelong problems.  The exact cause of preterm labor is unknown. However, having an abnormal uterus, an STI (sexually transmitted infection), or vaginal bleeding during pregnancy increases your risk for preterm labor.  Keep all follow-up visits as told by your health care provider. This is important.  Contact a health care provider if you have signs or symptoms of preterm labor. This information is not intended to replace advice given to you by your health care provider. Make sure  you discuss any questions you have with your health care provider. Document Revised: 07/25/2019 Document Reviewed: 07/25/2019 Elsevier Patient Education  2021 [employer].

## 2020-12-20 DIAGNOSIS — Z3685 Encounter for antenatal screening for Streptococcus B: Secondary | ICD-10-CM | POA: Diagnosis not present

## 2020-12-27 ENCOUNTER — Telehealth (HOSPITAL_COMMUNITY): Payer: Self-pay | Admitting: *Deleted

## 2020-12-27 NOTE — Telephone Encounter (Signed)
Preadmission screen  

## 2020-12-30 ENCOUNTER — Encounter (HOSPITAL_COMMUNITY): Payer: Self-pay

## 2020-12-30 ENCOUNTER — Encounter (HOSPITAL_COMMUNITY): Payer: Self-pay | Admitting: Obstetrics and Gynecology

## 2020-12-30 NOTE — Patient Instructions (Addendum)
ELIKA GODAR  12/30/2020   Your procedure is scheduled on:  01/07/2021  Arrive at 0530 at Entrance C on [address] at [facility]  and [address]. You are invited to use the FREE valet parking or use the Visitor's parking deck.  Pick up the phone at the desk and dial [phone].  Call this number if you have problems the morning of surgery: [phone]  Remember:   Do not eat food:(After Midnight) Desps de medianoche.  Do not drink clear liquids: (After Midnight) Desps de medianoche.  Take these medicines the morning of surgery with A SIP OF WATER:  Take sertraline as prescribed   Do not wear jewelry, make-up or nail polish.  Do not wear lotions, powders, or perfumes. Do not wear deodorant.  Do not shave 48 hours prior to surgery.  Do not bring valuables to the hospital.  [facility] is not   responsible for any belongings or valuables brought to the hospital.  Contacts, dentures or bridgework may not be worn into surgery.  Leave suitcase in the car. After surgery it may be brought to your room.  For patients admitted to the hospital, checkout time is 11:00 AM the day of              discharge.      Please read over the following fact sheets that you were given:     Preparing for Surgery

## 2021-01-03 ENCOUNTER — Other Ambulatory Visit (HOSPITAL_COMMUNITY)
Admission: RE | Admit: 2021-01-03 | Discharge: 2021-01-03 | Disposition: A | Discharge status: Home / Self Care | Payer: 59 | Source: Ambulatory Visit | Attending: Obstetrics and Gynecology | Admitting: Obstetrics and Gynecology

## 2021-01-03 DIAGNOSIS — Z01812 Encounter for preprocedural laboratory examination: Secondary | ICD-10-CM | POA: Diagnosis not present

## 2021-01-03 DIAGNOSIS — Z20822 Contact with and (suspected) exposure to covid-19: Secondary | ICD-10-CM | POA: Insufficient documentation

## 2021-01-03 LAB — SARS CORONAVIRUS 2 (TAT 6-24 HRS): SARS Coronavirus 2: NEGATIVE

## 2021-01-06 ENCOUNTER — Inpatient Hospital Stay (HOSPITAL_COMMUNITY)
Admission: AD | Admit: 2021-01-06 | Discharge: 2021-01-06 | Disposition: A | Discharge status: Home / Self Care | Payer: 59 | Source: Home / Self Care | Attending: Obstetrics & Gynecology | Admitting: Obstetrics & Gynecology

## 2021-01-06 ENCOUNTER — Other Ambulatory Visit: Payer: Self-pay

## 2021-01-06 ENCOUNTER — Encounter (HOSPITAL_COMMUNITY): Payer: Self-pay | Admitting: Obstetrics and Gynecology

## 2021-01-06 DIAGNOSIS — Z3A00 Weeks of gestation of pregnancy not specified: Secondary | ICD-10-CM | POA: Diagnosis not present

## 2021-01-06 DIAGNOSIS — O9902 Anemia complicating childbirth: Secondary | ICD-10-CM | POA: Diagnosis not present

## 2021-01-06 DIAGNOSIS — O34211 Maternal care for low transverse scar from previous cesarean delivery: Secondary | ICD-10-CM | POA: Diagnosis not present

## 2021-01-06 DIAGNOSIS — O4290 Premature rupture of membranes, unspecified as to length of time between rupture and onset of labor, unspecified weeks of gestation: Secondary | ICD-10-CM | POA: Diagnosis not present

## 2021-01-06 DIAGNOSIS — Z3A39 39 weeks gestation of pregnancy: Secondary | ICD-10-CM | POA: Diagnosis not present

## 2021-01-06 DIAGNOSIS — Z01812 Encounter for preprocedural laboratory examination: Secondary | ICD-10-CM | POA: Insufficient documentation

## 2021-01-06 LAB — CBC
HCT: 31.3 % — ABNORMAL LOW (ref 36.0–46.0)
Hemoglobin: 10 g/dL — ABNORMAL LOW (ref 12.0–15.0)
MCH: 26.6 pg (ref 26.0–34.0)
MCHC: 31.9 g/dL (ref 30.0–36.0)
MCV: 83.2 fL (ref 80.0–100.0)
Platelets: 310 10*3/uL (ref 150–400)
RBC: 3.76 MIL/uL — ABNORMAL LOW (ref 3.87–5.11)
RDW: 13.7 % (ref 11.5–15.5)
WBC: 8.1 10*3/uL (ref 4.0–10.5)
nRBC: 0 % (ref 0.0–0.2)

## 2021-01-06 LAB — TYPE AND SCREEN
ABO/RH(D): O POS
Antibody Screen: NEGATIVE

## 2021-01-06 LAB — RPR: RPR Ser Ql: NONREACTIVE

## 2021-01-06 NOTE — MAU Note (Signed)
Preop labs only, here due to holiday. Not an MAU visit

## 2021-01-07 ENCOUNTER — Inpatient Hospital Stay (HOSPITAL_COMMUNITY)
Admission: RE | Admit: 2021-01-07 | Discharge: 2021-01-08 | DRG: 788 | Disposition: A | Discharge status: Home / Self Care | Payer: 59 | Attending: Obstetrics and Gynecology | Admitting: Obstetrics and Gynecology

## 2021-01-07 ENCOUNTER — Inpatient Hospital Stay (HOSPITAL_COMMUNITY): Payer: 59 | Admitting: Anesthesiology

## 2021-01-07 ENCOUNTER — Encounter (HOSPITAL_COMMUNITY): Payer: Self-pay | Admitting: Obstetrics and Gynecology

## 2021-01-07 ENCOUNTER — Encounter (HOSPITAL_COMMUNITY)
Admission: RE | Disposition: A | Discharge status: Home / Self Care | Payer: Self-pay | Source: Home / Self Care | Attending: Obstetrics and Gynecology

## 2021-01-07 DIAGNOSIS — O34211 Maternal care for low transverse scar from previous cesarean delivery: Secondary | ICD-10-CM | POA: Diagnosis present

## 2021-01-07 DIAGNOSIS — Z3A39 39 weeks gestation of pregnancy: Secondary | ICD-10-CM

## 2021-01-07 DIAGNOSIS — O9902 Anemia complicating childbirth: Secondary | ICD-10-CM | POA: Diagnosis present

## 2021-01-07 DIAGNOSIS — Z98891 History of uterine scar from previous surgery: Secondary | ICD-10-CM

## 2021-01-07 SURGERY — Surgical Case
Anesthesia: Spinal

## 2021-01-07 MED ORDER — PHENYLEPHRINE 40 MCG/ML (10ML) SYRINGE FOR IV PUSH (FOR BLOOD PRESSURE SUPPORT)
PREFILLED_SYRINGE | INTRAVENOUS | Status: AC
  Filled 2021-01-07: qty 10

## 2021-01-07 MED ORDER — DIBUCAINE (PERIANAL) 1 % EX OINT: 1.0000 "application " | TOPICAL_OINTMENT | CUTANEOUS | Status: DC | PRN

## 2021-01-07 MED ORDER — SCOPOLAMINE 1 MG/3DAYS TD PT72
MEDICATED_PATCH | TRANSDERMAL | Status: AC
  Filled 2021-01-07: qty 1

## 2021-01-07 MED ORDER — MEPERIDINE HCL 25 MG/ML IJ SOLN: 6.2500 mg | INTRAMUSCULAR | Status: DC | PRN

## 2021-01-07 MED ORDER — FENTANYL CITRATE (PF) 100 MCG/2ML IJ SOLN
INTRAMUSCULAR | Status: DC | PRN
  Administered 2021-01-07: 15 ug via INTRATHECAL

## 2021-01-07 MED ORDER — MORPHINE SULFATE (PF) 0.5 MG/ML IJ SOLN
INTRAMUSCULAR | Status: DC | PRN
  Administered 2021-01-07: 150 ug via INTRATHECAL

## 2021-01-07 MED ORDER — LACTATED RINGERS IV SOLN: INTRAVENOUS | Status: DC

## 2021-01-07 MED ORDER — TETANUS-DIPHTH-ACELL PERTUSSIS 5-2.5-18.5 LF-MCG/0.5 IM SUSY: 0.5000 mL | PREFILLED_SYRINGE | Freq: Once | INTRAMUSCULAR | Status: DC

## 2021-01-07 MED ORDER — OXYTOCIN-SODIUM CHLORIDE 30-0.9 UT/500ML-% IV SOLN
INTRAVENOUS | Status: AC
  Filled 2021-01-07: qty 500

## 2021-01-07 MED ORDER — MORPHINE SULFATE (PF) 0.5 MG/ML IJ SOLN
INTRAMUSCULAR | Status: AC
  Filled 2021-01-07: qty 10

## 2021-01-07 MED ORDER — OXYCODONE HCL 5 MG PO TABS
5.0000 mg | ORAL_TABLET | ORAL | Status: DC | PRN
  Administered 2021-01-08: 5 mg via ORAL
  Filled 2021-01-07: qty 1

## 2021-01-07 MED ORDER — FENTANYL CITRATE (PF) 100 MCG/2ML IJ SOLN
INTRAMUSCULAR | Status: AC
  Filled 2021-01-07: qty 2

## 2021-01-07 MED ORDER — SODIUM CHLORIDE 0.9 % IR SOLN
Status: DC | PRN
  Administered 2021-01-07: 1

## 2021-01-07 MED ORDER — ZOLPIDEM TARTRATE 5 MG PO TABS: 5.0000 mg | ORAL_TABLET | Freq: Every evening | ORAL | Status: DC | PRN

## 2021-01-07 MED ORDER — KETOROLAC TROMETHAMINE 30 MG/ML IJ SOLN
30.0000 mg | Freq: Once | INTRAMUSCULAR | Status: AC | PRN
  Administered 2021-01-07: 30 mg via INTRAVENOUS

## 2021-01-07 MED ORDER — KETOROLAC TROMETHAMINE 30 MG/ML IJ SOLN: 30.0000 mg | Freq: Four times a day (QID) | INTRAMUSCULAR | Status: DC | PRN

## 2021-01-07 MED ORDER — COCONUT OIL OIL: 1.0000 "application " | TOPICAL_OIL | Status: DC | PRN

## 2021-01-07 MED ORDER — CEFOTETAN DISODIUM 2 G IJ SOLR
2.0000 g | INTRAMUSCULAR | Status: AC
  Administered 2021-01-07: 2 g via INTRAVENOUS

## 2021-01-07 MED ORDER — DIPHENHYDRAMINE HCL 50 MG/ML IJ SOLN: 12.5000 mg | INTRAMUSCULAR | Status: DC | PRN

## 2021-01-07 MED ORDER — SODIUM CHLORIDE 0.9 % IV SOLN
INTRAVENOUS | Status: AC
  Filled 2021-01-07: qty 2

## 2021-01-07 MED ORDER — DEXAMETHASONE SODIUM PHOSPHATE 4 MG/ML IJ SOLN
INTRAMUSCULAR | Status: AC
  Filled 2021-01-07: qty 2

## 2021-01-07 MED ORDER — SIMETHICONE 80 MG PO CHEW: 80.0000 mg | CHEWABLE_TABLET | ORAL | Status: DC | PRN

## 2021-01-07 MED ORDER — METOCLOPRAMIDE HCL 5 MG/ML IJ SOLN
INTRAMUSCULAR | Status: AC
  Filled 2021-01-07: qty 2

## 2021-01-07 MED ORDER — MENTHOL 3 MG MT LOZG: 1.0000 | LOZENGE | OROMUCOSAL | Status: DC | PRN

## 2021-01-07 MED ORDER — PRENATAL MULTIVITAMIN CH
1.0000 | ORAL_TABLET | Freq: Every day | ORAL | Status: DC
  Administered 2021-01-07 – 2021-01-08 (×2): 1 via ORAL
  Filled 2021-01-07 (×2): qty 1

## 2021-01-07 MED ORDER — WITCH HAZEL-GLYCERIN EX PADS: 1.0000 "application " | MEDICATED_PAD | CUTANEOUS | Status: DC | PRN

## 2021-01-07 MED ORDER — NALOXONE HCL 0.4 MG/ML IJ SOLN: 0.4000 mg | INTRAMUSCULAR | Status: DC | PRN

## 2021-01-07 MED ORDER — NALBUPHINE HCL 10 MG/ML IJ SOLN: 5.0000 mg | INTRAMUSCULAR | Status: DC | PRN

## 2021-01-07 MED ORDER — LACTATED RINGERS IV SOLN: INTRAVENOUS | Status: DC | PRN

## 2021-01-07 MED ORDER — PROMETHAZINE HCL 25 MG/ML IJ SOLN: 6.2500 mg | INTRAMUSCULAR | Status: DC | PRN

## 2021-01-07 MED ORDER — IBUPROFEN 600 MG PO TABS
600.0000 mg | ORAL_TABLET | Freq: Four times a day (QID) | ORAL | Status: DC
  Administered 2021-01-07 – 2021-01-08 (×5): 600 mg via ORAL
  Filled 2021-01-07 (×5): qty 1

## 2021-01-07 MED ORDER — SCOPOLAMINE 1 MG/3DAYS TD PT72
1.0000 | MEDICATED_PATCH | Freq: Once | TRANSDERMAL | Status: DC
  Administered 2021-01-07: 1.5 mg via TRANSDERMAL

## 2021-01-07 MED ORDER — BUPIVACAINE IN DEXTROSE 0.75-8.25 % IT SOLN
INTRATHECAL | Status: DC | PRN
  Administered 2021-01-07: 1.4 mL via INTRATHECAL

## 2021-01-07 MED ORDER — PHENYLEPHRINE HCL (PRESSORS) 10 MG/ML IV SOLN
INTRAVENOUS | Status: DC | PRN
  Administered 2021-01-07: 80 ug via INTRAVENOUS
  Administered 2021-01-07: 160 ug via INTRAVENOUS
  Administered 2021-01-07: 120 ug via INTRAVENOUS

## 2021-01-07 MED ORDER — DIPHENHYDRAMINE HCL 25 MG PO CAPS: 25.0000 mg | ORAL_CAPSULE | Freq: Four times a day (QID) | ORAL | Status: DC | PRN

## 2021-01-07 MED ORDER — DIPHENHYDRAMINE HCL 25 MG PO CAPS: 25.0000 mg | ORAL_CAPSULE | ORAL | Status: DC | PRN

## 2021-01-07 MED ORDER — HYDROMORPHONE HCL 1 MG/ML IJ SOLN
0.2500 mg | INTRAMUSCULAR | Status: DC | PRN
Start: 2021-01-07 — End: 2021-01-07

## 2021-01-07 MED ORDER — DEXAMETHASONE SODIUM PHOSPHATE 4 MG/ML IJ SOLN
INTRAMUSCULAR | Status: DC | PRN
  Administered 2021-01-07: 8 mg via INTRAVENOUS

## 2021-01-07 MED ORDER — NALOXONE HCL 4 MG/10ML IJ SOLN
1.0000 ug/kg/h | INTRAVENOUS | Status: DC | PRN
  Filled 2021-01-07: qty 5

## 2021-01-07 MED ORDER — STERILE WATER FOR IRRIGATION IR SOLN
Status: DC | PRN
  Administered 2021-01-07: 1

## 2021-01-07 MED ORDER — SERTRALINE HCL 50 MG PO TABS
50.0000 mg | ORAL_TABLET | Freq: Every day | ORAL | Status: DC
  Administered 2021-01-08: 50 mg via ORAL
  Filled 2021-01-07: qty 1

## 2021-01-07 MED ORDER — NALBUPHINE HCL 10 MG/ML IJ SOLN: 5.0000 mg | Freq: Once | INTRAMUSCULAR | Status: DC | PRN

## 2021-01-07 MED ORDER — OXYTOCIN-SODIUM CHLORIDE 30-0.9 UT/500ML-% IV SOLN
INTRAVENOUS | Status: DC | PRN
  Administered 2021-01-07: 200 mL via INTRAVENOUS

## 2021-01-07 MED ORDER — PHENYLEPHRINE HCL-NACL 20-0.9 MG/250ML-% IV SOLN
INTRAVENOUS | Status: AC
  Filled 2021-01-07: qty 250

## 2021-01-07 MED ORDER — ONDANSETRON HCL 4 MG/2ML IJ SOLN: 4.0000 mg | Freq: Three times a day (TID) | INTRAMUSCULAR | Status: DC | PRN

## 2021-01-07 MED ORDER — ACETAMINOPHEN 500 MG PO TABS
1000.0000 mg | ORAL_TABLET | Freq: Four times a day (QID) | ORAL | Status: AC
  Administered 2021-01-07 – 2021-01-08 (×4): 1000 mg via ORAL
  Filled 2021-01-07 (×4): qty 2

## 2021-01-07 MED ORDER — OXYTOCIN-SODIUM CHLORIDE 30-0.9 UT/500ML-% IV SOLN
2.5000 [IU]/h | INTRAVENOUS | Status: DC
  Administered 2021-01-07: 2.5 [IU]/h via INTRAVENOUS
  Filled 2021-01-07: qty 500

## 2021-01-07 MED ORDER — TRAMADOL HCL 50 MG PO TABS: 50.0000 mg | ORAL_TABLET | Freq: Four times a day (QID) | ORAL | Status: DC | PRN

## 2021-01-07 MED ORDER — SIMETHICONE 80 MG PO CHEW
80.0000 mg | CHEWABLE_TABLET | Freq: Three times a day (TID) | ORAL | Status: DC
  Administered 2021-01-07 – 2021-01-08 (×5): 80 mg via ORAL
  Filled 2021-01-07 (×5): qty 1

## 2021-01-07 MED ORDER — SENNOSIDES-DOCUSATE SODIUM 8.6-50 MG PO TABS
2.0000 | ORAL_TABLET | Freq: Every day | ORAL | Status: DC
  Administered 2021-01-08: 2 via ORAL
  Filled 2021-01-07: qty 2

## 2021-01-07 MED ORDER — PHENYLEPHRINE HCL-NACL 20-0.9 MG/250ML-% IV SOLN
INTRAVENOUS | Status: DC | PRN
  Administered 2021-01-07: 60 ug/min via INTRAVENOUS

## 2021-01-07 MED ORDER — SODIUM CHLORIDE 0.9% FLUSH: 3.0000 mL | INTRAVENOUS | Status: DC | PRN

## 2021-01-07 MED ORDER — ONDANSETRON HCL 4 MG/2ML IJ SOLN
INTRAMUSCULAR | Status: DC | PRN
  Administered 2021-01-07: 4 mg via INTRAVENOUS

## 2021-01-07 MED ORDER — KETOROLAC TROMETHAMINE 30 MG/ML IJ SOLN
INTRAMUSCULAR | Status: AC
  Filled 2021-01-07: qty 1

## 2021-01-07 MED ORDER — ONDANSETRON HCL 4 MG/2ML IJ SOLN
INTRAMUSCULAR | Status: AC
  Filled 2021-01-07: qty 2

## 2021-01-07 MED ORDER — POVIDONE-IODINE 10 % EX SWAB
2.0000 | Freq: Once | CUTANEOUS | Status: AC
Start: 2021-01-07 — End: 2021-01-07
  Administered 2021-01-07: 2 via TOPICAL

## 2021-01-07 SURGICAL SUPPLY — 34 items
APL SKNCLS STERI-STRIP NONHPOA (GAUZE/BANDAGES/DRESSINGS) ×1
BARRIER ADHS 3X4 INTERCEED (GAUZE/BANDAGES/DRESSINGS) IMPLANT
BENZOIN TINCTURE PRP APPL 2/3 (GAUZE/BANDAGES/DRESSINGS) ×2 IMPLANT
BRR ADH 4X3 ABS CNTRL BYND (GAUZE/BANDAGES/DRESSINGS)
CHLORAPREP W/TINT 26ML (MISCELLANEOUS) ×2 IMPLANT
CLAMP CORD UMBIL (MISCELLANEOUS) IMPLANT
CLOTH BEACON ORANGE TIMEOUT ST (SAFETY) ×2 IMPLANT
DRSG OPSITE POSTOP 4X10 (GAUZE/BANDAGES/DRESSINGS) ×2 IMPLANT
ELECT REM PT RETURN 9FT ADLT (ELECTROSURGICAL) ×2
ELECTRODE REM PT RTRN 9FT ADLT (ELECTROSURGICAL) ×1 IMPLANT
EXTRACTOR VACUUM M CUP 4 TUBE (SUCTIONS) IMPLANT
GLOVE BIO SURGEON STRL SZ 6.5 (GLOVE) ×2 IMPLANT
GLOVE BIOGEL PI IND STRL 7.0 (GLOVE) ×1 IMPLANT
GLOVE BIOGEL PI INDICATOR 7.0 (GLOVE) ×1
GOWN STRL REUS W/TWL LRG LVL3 (GOWN DISPOSABLE) ×4 IMPLANT
KIT ABG SYR 3ML LUER SLIP (SYRINGE) IMPLANT
NEEDLE HYPO 22GX1.5 SAFETY (NEEDLE) IMPLANT
NEEDLE HYPO 25X5/8 SAFETYGLIDE (NEEDLE) ×2 IMPLANT
NS IRRIG 1000ML POUR BTL (IV SOLUTION) ×2 IMPLANT
PACK C SECTION WH (CUSTOM PROCEDURE TRAY) ×2 IMPLANT
PAD OB MATERNITY 4.3X12.25 (PERSONAL CARE ITEMS) ×2 IMPLANT
PENCIL SMOKE EVAC W/HOLSTER (ELECTROSURGICAL) ×2 IMPLANT
STRIP CLOSURE SKIN 1/2X4 (GAUZE/BANDAGES/DRESSINGS) ×2 IMPLANT
SUT CHROMIC 0 CTX 36 (SUTURE) ×4 IMPLANT
SUT PLAIN 0 NONE (SUTURE) IMPLANT
SUT PLAIN 2 0 XLH (SUTURE) IMPLANT
SUT VIC AB 0 CT1 27 (SUTURE) ×6
SUT VIC AB 0 CT1 27XBRD ANBCTR (SUTURE) ×3 IMPLANT
SUT VIC AB 4-0 KS 27 (SUTURE) IMPLANT
SYR CONTROL 10ML LL (SYRINGE) IMPLANT
TOWEL OR 17X24 6PK STRL BLUE (TOWEL DISPOSABLE) ×2 IMPLANT
TRAY FOLEY W/BAG SLVR 14FR LF (SET/KITS/TRAYS/PACK) ×2 IMPLANT
VACUUM CUP M-STYLE MYSTIC II (SUCTIONS) IMPLANT
WATER STERILE IRR 1000ML POUR (IV SOLUTION) ×2 IMPLANT

## 2021-01-07 NOTE — Op Note (Signed)
NAMEAUNA, MIKKELSEN MEDICAL RECORD NO: [id number] ACCOUNT NO: [id number] DATE OF BIRTH: 1986/01/11 FACILITY: [facility] LOCATION: [facility] PHYSICIAN: Deandre Brannan L. Vincente Poli, MD  Operative Report   DATE OF PROCEDURE: 01/07/2021  PREOPERATIVE DIAGNOSES:   1.  Intrauterine pregnancy at 39 weeks and 1 day. 2.  Previous cesarean section.  POSTOPERATIVE DIAGNOSES:   1.  Intrauterine pregnancy at 39 weeks and 1 day. 2.  Previous cesarean section.  PROCEDURE:  Repeat low transverse cesarean section.  SURGEON:  Tomislav Micale L. Mallori Araque, MD  TYPE OF ANESTHESIA:  Spinal.  ESTIMATED BLOOD LOSS:  Less than 500 mL.  COMPLICATIONS:  None.  DRAINS:  Foley catheter.  DESCRIPTION OF PROCEDURE:  The patient was taken to the operating room.  Her spinal was placed by Dr. Arby Barrette without incident.  She was then prepped and draped in the usual sterile fashion.  Foley catheter had been inserted.  A low transverse incision  was made at the area of the previous C-section scar and carried down to the fascia.  The fascia was scored in the midline and extended laterally.  Rectus muscles were separated in the midline.  The peritoneum was entered bluntly.  The peritoneal incision  was then stretched.  The lower uterine segment was identified.  The bladder flap was created sharply and then digitally.  The bladder blade was then readjusted.  A low transverse incision was made in the uterus.  Uterus was then entered using a  hemostat.  The baby was cephalic presentation and was delivered easily with the vacuum extractor, was a female infant, Apgars 8 at 1 minute and 9 at 5 minutes.  The cord was clamped and cut.  The baby was handed to the waiting neonatal team.  The  placenta was manually removed and noted to be normal intact with a 3-vessel cord.  The uterus was exteriorized and cleared of all clots and debris.  Uterine incision was closed in two layers using 0 chromic in a running locked stitch.  Irrigation was  performed.   Hemostasis was noted.  The peritoneum was closed using 0 Vicryl in a running stitch starting at each corner and meeting in the midline.  The fascia was closed using 0 Vicryl starting in each corner meeting in the midline.  After irrigation,  the subcutaneous was closed with 0 plain gut in a running fashion.  The skin was closed with 3-0 Vicryl in a subcuticular fashion.  Benzoin, Steri-Strips, and a honeycomb dressing were applied.  All sponge, lap and instrument counts were correct x2.   Patient went to recovery room in stable condition.   PUS D: 01/07/2021 11:36:19 am T: 01/07/2021 12:13:00 pm  JOB: [id number]/ [id number]

## 2021-01-07 NOTE — Anesthesia Postprocedure Evaluation (Signed)
Anesthesia Post Note  Patient: KATALYN MATIN  Procedure(s) Performed: REPEAT CESAREAN SECTION EDC: 01-13-21 ALLERG: NKDA     Patient location during evaluation: PACU Anesthesia Type: Spinal Level of consciousness: awake Pain management: pain level controlled Vital Signs Assessment: post-procedure vital signs reviewed and stable Respiratory status: spontaneous breathing Cardiovascular status: stable Postop Assessment: no headache, no backache, spinal receding, patient able to bend at knees and no apparent nausea or vomiting Anesthetic complications: no   No notable events documented.  Last Vitals:  Vitals:   01/07/21 1025 01/07/21 1116  BP: 103/69   Pulse: 69 60  Resp: 17 17  Temp: 36.5 C   SpO2: 97% 98%    Last Pain:  Vitals:   01/07/21 1116  TempSrc:   PainSc: 0-No pain   Pain Goal:                   Caren Macadam

## 2021-01-07 NOTE — Brief Op Note (Signed)
01/07/2021  8:34 AM  PATIENT:  Brianna Bell  35 y.o. female  PRE-OPERATIVE DIAGNOSIS:   IUP at 68 w 1 day Previous Cesarean section  POST-OPERATIVE DIAGNOSIS:   Same  PROCEDURE:  Procedure(s): REPEAT CESAREAN SECTION EDC: 01-13-21 ALLERG: NKDA (N/A)  SURGEON:  Surgeon(s) and Role:    * Marcelle Overlie, MD - Primary  PHYSICIAN ASSISTANT:   ASSISTANTS: none   ANESTHESIA:   spinal  EBL:  less than 500 cc   BLOOD ADMINISTERED:none  DRAINS: Urinary Catheter (Foley)   LOCAL MEDICATIONS USED:  NONE  SPECIMEN:  No Specimen  DISPOSITION OF SPECIMEN:  N/A  COUNTS:  YES  TOURNIQUET:  * No tourniquets in log *  DICTATION: .Other Dictation: Dictation Number dictated  PLAN OF CARE: Admit to inpatient   PATIENT DISPOSITION:  PACU - hemodynamically stable.   Delay start of Pharmacological VTE agent (>24hrs) due to surgical blood loss or risk of bleeding: not applicable

## 2021-01-07 NOTE — Transfer of Care (Signed)
Immediate Anesthesia Transfer of Care Note  Patient: Brianna Bell  Procedure(s) Performed: REPEAT CESAREAN SECTION EDC: 01-13-21 ALLERG: NKDA  Patient Location: PACU  Anesthesia Type:Spinal  Level of Consciousness: awake, alert  and oriented  Airway & Oxygen Therapy: Patient Spontanous Breathing  Post-op Assessment: Report given to RN and Post -op Vital signs reviewed and stable  Post vital signs: Reviewed and stable  Last Vitals:  Vitals Value Taken Time  BP 104/60 01/07/21 0848  Temp    Pulse 76 01/07/21 0851  Resp 18 01/07/21 0851  SpO2 100 % 01/07/21 0851  Vitals shown include unvalidated device data.  Last Pain:  Vitals:   01/07/21 0611  TempSrc: Oral         Complications: No notable events documented.

## 2021-01-07 NOTE — Lactation Note (Signed)
This note was copied from a baby's chart. Lactation Consultation Note  Patient Name: Girl Sayre Mazor ZYSAY'T Date: 01/07/2021  [facility] checked in with RN, Gilmore Laroche, stated Mom declined LC services at this time.  Age:34 hours  Maternal Data    Feeding    LATCH Score                    Lactation Tools Discussed/Used    Interventions    Discharge    Consult Status      Deanndra Kirley  Nicholson-Springer 01/07/2021, 3:43 PM

## 2021-01-07 NOTE — H&P (Signed)
Brianna Bell is a 35 y.o. G 3 P 1011 at 33 w 1 day presents for Repeat C Section. OB History     Gravida  3   Para  1   Term  1   Preterm      AB  1   Living  1      SAB      IAB  1   Ectopic      Multiple      Live Births  1          Past Medical History:  Diagnosis Date   Anxiety    Past Surgical History:  Procedure Laterality Date   CESAREAN SECTION N/A 11/12/2017   Procedure: CESAREAN SECTION;  Surgeon: Mitchel Honour, DO;  Location: [facility];  Service: Obstetrics;  Laterality: N/A;   DILATION AND EVACUATION N/A 04/23/2017   Procedure: DILATATION AND EVACUATION with ultrasound guidance;  Surgeon: Marcelle Overlie, MD;  Location: [facility];  Service: Gynecology;  Laterality: N/A;   DILATION AND EVACUATION N/A 06/27/2019   Procedure: DILATATION AND EVACUATION;  Surgeon: Mitchel Honour, DO;  Location: [facility];  Service: Gynecology;  Laterality: N/A;   Family History: family history is not on file. Social History:  reports that she has never smoked. She has never used smokeless tobacco. She reports previous alcohol use. She reports that she does not use drugs.     Maternal Diabetes: No Genetic Screening: Normal Maternal Ultrasounds/Referrals: Normal Fetal Ultrasounds or other Referrals:  None Maternal Substance Abuse:  No Significant Maternal Medications:  None Significant Maternal Lab Results:  None Other Comments:  None  Review of Systems  All other systems reviewed and are negative. History   Pulse 93, temperature 98.6 F (37 C), temperature source Oral, resp. rate 16, height 5\' 4"  (1.626 m), weight 81.3 kg, SpO2 99 %. Exam Physical Exam Vitals and nursing note reviewed. Exam conducted with a chaperone present.  Constitutional:      Appearance: Normal appearance.  HENT:     Head: Normocephalic.  Cardiovascular:     Rate and Rhythm: Regular rhythm. Tachycardia present.     Pulses: Normal pulses.  Musculoskeletal:      Cervical back: Normal range of motion.  Neurological:     Mental Status: She is alert.    Prenatal labs: ABO, Rh: --/--/O POS (07/04 0901) Antibody: NEG (07/04 0901) Rubella:   RPR: NON REACTIVE (07/04 0901)  HBsAg:    HIV:    GBS:     Assessment/Plan: IUP at 39 w 1 day Previous C Section Repeat LTCS   11-23-1988 01/07/2021, 7:21 AM

## 2021-01-07 NOTE — Anesthesia Preprocedure Evaluation (Signed)
Anesthesia Evaluation  Patient identified by MRN, date of birth, ID band Patient awake    Reviewed: Allergy & Precautions, H&P , NPO status , Patient's Chart, lab work & pertinent test results  Airway Mallampati: I  TM Distance: >3 FB Neck ROM: full    Dental no notable dental hx.    Pulmonary neg pulmonary ROS,    Pulmonary exam normal breath sounds clear to auscultation       Cardiovascular negative cardio ROS   Rhythm:regular Rate:Normal     Neuro/Psych PSYCHIATRIC DISORDERS Anxiety negative neurological ROS     GI/Hepatic negative GI ROS, Neg liver ROS,   Endo/Other  negative endocrine ROS  Renal/GU negative Renal ROS  negative genitourinary   Musculoskeletal negative musculoskeletal ROS (+)   Abdominal Normal abdominal exam  (+)   Peds  Hematology negative hematology ROS (+)   Anesthesia Other Findings   Reproductive/Obstetrics (+) Pregnancy                             Anesthesia Physical Anesthesia Plan  ASA: 2  Anesthesia Plan: Spinal   Post-op Pain Management:    Induction:   PONV Risk Score and Plan: 3 and Ondansetron, Dexamethasone and Scopolamine patch - Pre-op  Airway Management Planned: Natural Airway, Nasal Cannula and Simple Face Mask  Additional Equipment: None  Intra-op Plan:   Post-operative Plan:   Informed Consent: I have reviewed the patients History and Physical, chart, labs and discussed the procedure including the risks, benefits and alternatives for the proposed anesthesia with the patient or authorized representative who has indicated his/her understanding and acceptance.       Plan Discussed with: CRNA  Anesthesia Plan Comments:         Anesthesia Quick Evaluation

## 2021-01-07 NOTE — Anesthesia Procedure Notes (Signed)
Spinal  Patient location during procedure: OR Start time: 01/07/2021 7:40 AM End time: 01/07/2021 7:42 AM Reason for block: surgical anesthesia Staffing Performed: anesthesiologist  Anesthesiologist: Leilani Able, MD Preanesthetic Checklist Completed: patient identified, IV checked, site marked, risks and benefits discussed, surgical consent, monitors and equipment checked, pre-op evaluation and timeout performed Spinal Block Patient position: sitting Prep: DuraPrep and site prepped and draped Patient monitoring: continuous pulse ox and blood pressure Approach: midline Location: L3-4 Injection technique: single-shot Needle Needle type: Pencan  Needle gauge: 24 G Needle length: 10 cm Needle insertion depth: 5 cm Assessment Sensory level: T4 Events: CSF return

## 2021-01-08 ENCOUNTER — Other Ambulatory Visit (HOSPITAL_COMMUNITY): Payer: Self-pay

## 2021-01-08 LAB — CBC
HCT: 25.3 % — ABNORMAL LOW (ref 36.0–46.0)
Hemoglobin: 8.2 g/dL — ABNORMAL LOW (ref 12.0–15.0)
MCH: 27 pg (ref 26.0–34.0)
MCHC: 32.4 g/dL (ref 30.0–36.0)
MCV: 83.2 fL (ref 80.0–100.0)
Platelets: 283 10*3/uL (ref 150–400)
RBC: 3.04 MIL/uL — ABNORMAL LOW (ref 3.87–5.11)
RDW: 13.9 % (ref 11.5–15.5)
WBC: 11.9 10*3/uL — ABNORMAL HIGH (ref 4.0–10.5)
nRBC: 0 % (ref 0.0–0.2)

## 2021-01-08 MED ORDER — IBUPROFEN 600 MG PO TABS
600.0000 mg | ORAL_TABLET | Freq: Three times a day (TID) | ORAL | 0 refills | Status: DC
  Filled 2021-01-08: qty 30, 10d supply, fill #0

## 2021-01-08 MED ORDER — OXYCODONE HCL 5 MG PO TABS
5.0000 mg | ORAL_TABLET | ORAL | 0 refills | Status: DC
  Filled 2021-01-08: qty 15, 3d supply, fill #0

## 2021-01-08 MED ORDER — FERROUS SULFATE 325 (65 FE) MG PO TABS
325.0000 mg | ORAL_TABLET | Freq: Every day | ORAL | Status: DC
  Administered 2021-01-08: 325 mg via ORAL
  Filled 2021-01-08: qty 1

## 2021-01-08 MED ORDER — DOCUSATE SODIUM 100 MG PO CAPS: ORAL_CAPSULE | ORAL | 3 refills | Status: DC

## 2021-01-08 MED ORDER — FERROUS SULFATE 325 (65 FE) MG PO TABS: ORAL_TABLET | ORAL | 2 refills | Status: DC

## 2021-01-08 NOTE — Social Work (Addendum)
CSW received consult for hx of Anxiety and Postpartum Depression.  CSW met with MOB to offer support and complete assessment.    CSW met with MOB bedside. CSW observed MOB eating lunch and infant resting on a Boppy pillow next to her. CSW congratulated MOB and introduced CSW role. MOB presented pleasant, calm and engaged with CSW. CSW confirm MOB demographic on file is correct. CSW inquired how MOB has felt since giving birth. MOB reports feeling a little sore but otherwise feeling good emotionally.   CSW inquired about MOB history of anxiety and postpartum. MOB acknowledges she has a history of anxiety. MOB reports she takes Zoloft 50 mg for her anxiety symptoms. MOB denies history of postpartum depression. CSW provided education regarding the baby blues period vs. perinatal mood disorders, discussed treatment and gave resources for mental health follow up if concerns arise.  CSW recommended MOB complete a self-evaluation during the postpartum time period using the New Mom Checklist from Postpartum Progress and encouraged MOB to contact a medical professional if symptoms are noted at any time. CSW assessed MOB for safety. MOB denies thoughts of harm to self and others. MOB denies domestic violence. CSW inquired about MOB supports. MOB acknowledges her Fiance and family as supports.     CSW provided review of Sudden Infant Death Syndrome (SIDS) precautions and informed MOB no-co sleeping. MOB reports the infant will sleep in a bassinet. CSW inquired if MOB has essential items for the infant. MOB reports she has essential items for the infant. MOB has chosen [employer]. CSW assessed MOB additional needs. MOB reports no further need.     CSW identifies no further need for intervention and no barriers to discharge at this time.  Kathrin Greathouse, MSW, LCSW Women's and [place] Worker  [phone] 01/08/2021  12:54 PM

## 2021-01-08 NOTE — Discharge Summary (Signed)
Postpartum Discharge Summary       Patient Name: Brianna Bell DOB: May 22, 1986 MRN: [id number]  Date of admission: 01/07/2021 Delivery date:01/07/2021  Delivering provider: Dian Queen  Date of discharge: 01/08/2021  Admitting diagnosis: Previous cesarean section [Z98.891] S/P cesarean section [Z98.891] Intrauterine pregnancy: [redacted]w[redacted]d    Secondary diagnosis:  Active Problems:   Previous cesarean section  Additional problems: none    Discharge diagnosis: Term Pregnancy Delivered                                              Post partum procedures: none Augmentation: N/A Complications: None  Hospital course: Sceduled C/S   34y.o. yo [id number] admitted to the hospital 01/07/2021 for scheduled cesarean section with the following indication:Elective Repeat.Delivery details are as follows:  Membrane Rupture Time/Date: 8:03 AM ,01/07/2021   Delivery Method:C-Section, Vacuum Assisted  Details of operation can be found in separate operative note.  Patient had an uncomplicated postpartum course.  She is ambulating, tolerating a regular diet, passing flatus, and urinating well. Patient is discharged home in stable condition on  01/08/21        Newborn Data: Birth date:01/07/2021  Birth time:8:04 AM  Gender:Female  Living status:Living  Apgars:8 ,9  Weight:3250 g     Magnesium Sulfate received: No BMZ received: No Rhophylac:N/A MMR:N/A T-DaP:Given prenatally Flu: N/A Transfusion:No  Physical exam  Vitals:   01/07/21 1402 01/07/21 1711 01/07/21 2122 01/08/21 0515  BP: 100/62 109/64 (!) 101/56 (!) 99/59  Pulse: 68 (!) 56 (!) 58 63  Resp: _0 Temp: 98.4 F (36.9 C) 98.2 F (36.8 C) 98.1 F (36.7 C) 98.1 F (36.7 C)  TempSrc: Oral Oral Oral Oral  SpO2: 97% 97%  100%  Weight:      Height:       General: alert Lochia: appropriate Uterine Fundus: firm Incision: Healing well with no significant drainage DVT Evaluation: No evidence of DVT seen on  physical exam. Labs: Lab Results  Component Value Date   WBC 11.9 (H) 01/08/2021   HGB 8.2 (L) 01/08/2021   HCT 25.3 (L) 01/08/2021   MCV 83.2 01/08/2021   PLT 283 01/08/2021   CMP Latest Ref Rng & Units 12/10/2020  Glucose 70 - 99 mg/dL 104(H)  BUN 6 - 20 mg/dL 6  Creatinine 0.44 - 1.00 mg/dL 0.61  Sodium 135 - 145 mmol/L 133(L)  Potassium 3.5 - 5.1 mmol/L 3.8  Chloride 98 - 111 mmol/L 104  CO2 22 - 32 mmol/L 23  Calcium 8.9 - 10.3 mg/dL 8.6(L)  Total Protein 6.5 - 8.1 g/dL 6.0(L)  Total Bilirubin 0.3 - 1.2 mg/dL 0.4  Alkaline Phos 38 - 126 U/L 99  AST 15 - 41 U/L 19  ALT 0 - 44 U/L 14   Edinburgh Score: Edinburgh Postnatal Depression Scale Screening Tool 01/08/2021  I have been able to laugh and see the funny side of things. (No Data)  I have looked forward with enjoyment to things. -  I have blamed myself unnecessarily when things went wrong. -  I have been anxious or worried for no good reason. -  I have felt scared or panicky for no good reason. -  Things have been getting on top of me. -  I have been so unhappy that I have had difficulty sleeping. -  I have felt sad or miserable. -  I have been so unhappy that I have been crying. -  The thought of harming myself has occurred to me. Flavia Shipper Postnatal Depression Scale Total -      After visit meds:  Allergies as of 01/08/2021   No Known Allergies      Medication List     STOP taking these medications    Butalbital-APAP-Caffeine 50-300-40 MG Caps   cyclobenzaprine 10 MG tablet Commonly known as: FLEXERIL       TAKE these medications    docusate sodium 100 MG capsule Commonly known as: Colace Take 1 capsule every day by oral route.   ferrous sulfate 325 (65 FE) MG tablet Take 1 tablet every day by oral route.   ibuprofen 600 MG tablet Commonly known as: ADVIL Take 1 tablet (600 mg total) by mouth 3 (three) times daily.   oxyCODONE 5 MG immediate release tablet Commonly known as: Oxy  IR/ROXICODONE Take 1 tablet (5 mg total) by mouth every 4 (four) hours.   prenatal multivitamin Tabs tablet Take 1 tablet by mouth daily at 12 noon.   sertraline 50 MG tablet Commonly known as: ZOLOFT TAKE 1 TABLET BY MOUTH ONCE DAILY. What changed: how much to take         Discharge home in stable condition Infant Feeding: Bottle and Breast Infant Disposition:home with mother Discharge instruction: per After Visit Summary and Postpartum booklet. Activity: Advance as tolerated. Pelvic rest for 6 weeks.  Diet: routine diet Anticipated Birth Control: Unsure Postpartum Appointment:6 weeks Additional Postpartum F/U:  none Future Appointments:No future appointments. Follow up Visit:      01/08/2021 Tyson Dense, MD

## 2021-01-08 NOTE — Progress Notes (Signed)
Subjective: Postpartum Day 1: Cesarean Delivery- RCS Patient reports tolerating PO and no problems voiding.    Objective: Vital signs in last 24 hours: Temp:  [97.7 F (36.5 C)-98.4 F (36.9 C)] 98.1 F (36.7 C) (07/06 0515) Pulse Rate:  [56-76] 63 (07/06 0515) Resp:  [13-21] 18 (07/06 0515) BP: (99-110)/(56-70) 99/59 (07/06 0515) SpO2:  [97 %-100 %] 100 % (07/06 0515)  Physical Exam:  General: alert and cooperative Lochia: appropriate Uterine Fundus: firm Incision: healing well, no significant drainage, no dehiscence DVT Evaluation: No evidence of DVT seen on physical exam.  Recent Labs    01/06/21 0901 01/08/21 0543  HGB 10.0* 8.2*  HCT 31.3* 25.3*    Assessment/Plan: Status post Cesarean section. Doing well postoperatively. Mild anemia - hgb 8.2. No s/s.Continue ferrous sulfate.  Continue current care.   Ranae Pila 01/08/2021, 7:42 AM

## 2021-01-09 ENCOUNTER — Other Ambulatory Visit (HOSPITAL_COMMUNITY): Payer: Self-pay

## 2021-01-13 ENCOUNTER — Other Ambulatory Visit (HOSPITAL_COMMUNITY): Payer: Self-pay

## 2021-01-13 MED ORDER — CEPHALEXIN 500 MG PO CAPS
500.0000 mg | ORAL_CAPSULE | Freq: Four times a day (QID) | ORAL | 0 refills | Status: DC
  Filled 2021-01-13: qty 24, 6d supply, fill #0

## 2021-01-20 ENCOUNTER — Telehealth (HOSPITAL_COMMUNITY): Payer: Self-pay | Admitting: *Deleted

## 2021-01-20 NOTE — Telephone Encounter (Signed)
Left message to return nurse call.  Duffy Rhody, RN 01/20/2021 at 1:20pm

## 2021-01-27 ENCOUNTER — Other Ambulatory Visit (HOSPITAL_COMMUNITY): Payer: Self-pay

## 2021-01-27 MED ORDER — METHYLPREDNISOLONE 4 MG PO TBPK
ORAL_TABLET | ORAL | 0 refills | Status: DC
  Filled 2021-01-27: qty 21, 6d supply, fill #0

## 2021-02-10 DIAGNOSIS — Z124 Encounter for screening for malignant neoplasm of cervix: Secondary | ICD-10-CM | POA: Diagnosis not present

## 2021-02-10 DIAGNOSIS — Z1389 Encounter for screening for other disorder: Secondary | ICD-10-CM | POA: Diagnosis not present

## 2021-02-14 ENCOUNTER — Other Ambulatory Visit (HOSPITAL_COMMUNITY): Payer: Self-pay

## 2021-02-14 MED FILL — Sertraline HCl Tab 50 MG: ORAL | 90 days supply | Qty: 90 | Fill #1 | Status: AC

## 2021-03-17 DIAGNOSIS — Z3483 Encounter for supervision of other normal pregnancy, third trimester: Secondary | ICD-10-CM | POA: Diagnosis not present

## 2021-03-17 DIAGNOSIS — Z3482 Encounter for supervision of other normal pregnancy, second trimester: Secondary | ICD-10-CM | POA: Diagnosis not present

## 2021-03-18 DIAGNOSIS — Z131 Encounter for screening for diabetes mellitus: Secondary | ICD-10-CM | POA: Diagnosis not present

## 2021-03-18 DIAGNOSIS — Z13220 Encounter for screening for lipoid disorders: Secondary | ICD-10-CM | POA: Diagnosis not present

## 2021-03-18 DIAGNOSIS — Z1329 Encounter for screening for other suspected endocrine disorder: Secondary | ICD-10-CM | POA: Diagnosis not present

## 2021-03-18 DIAGNOSIS — Z139 Encounter for screening, unspecified: Secondary | ICD-10-CM | POA: Diagnosis not present

## 2021-04-23 ENCOUNTER — Other Ambulatory Visit (HOSPITAL_COMMUNITY): Payer: Self-pay

## 2021-04-23 MED ORDER — AZITHROMYCIN 250 MG PO TABS
ORAL_TABLET | ORAL | 1 refills | Status: DC
  Filled 2021-04-23: qty 6, 5d supply, fill #0

## 2021-05-01 ENCOUNTER — Other Ambulatory Visit (HOSPITAL_COMMUNITY): Payer: Self-pay

## 2021-06-04 MED FILL — Sertraline HCl Tab 50 MG: ORAL | 90 days supply | Qty: 90 | Fill #2 | Status: CN

## 2021-06-05 ENCOUNTER — Other Ambulatory Visit (HOSPITAL_COMMUNITY): Payer: Self-pay

## 2021-06-13 ENCOUNTER — Other Ambulatory Visit (HOSPITAL_COMMUNITY): Payer: Self-pay

## 2021-06-13 MED FILL — Sertraline HCl Tab 50 MG: ORAL | 90 days supply | Qty: 90 | Fill #2 | Status: AC

## 2021-12-29 LAB — HEPATITIS C ANTIBODY: HCV Ab: NEGATIVE

## 2021-12-29 LAB — OB RESULTS CONSOLE RUBELLA ANTIBODY, IGM: Rubella: IMMUNE

## 2021-12-29 LAB — OB RESULTS CONSOLE HIV ANTIBODY (ROUTINE TESTING): HIV: NONREACTIVE

## 2021-12-29 LAB — OB RESULTS CONSOLE HEPATITIS B SURFACE ANTIGEN: Hepatitis B Surface Ag: NEGATIVE

## 2021-12-29 LAB — OB RESULTS CONSOLE ABO/RH: RH Type: POSITIVE

## 2021-12-29 LAB — OB RESULTS CONSOLE RPR: RPR: NONREACTIVE

## 2021-12-29 LAB — OB RESULTS CONSOLE ANTIBODY SCREEN: Antibody Screen: NEGATIVE

## 2022-01-13 LAB — OB RESULTS CONSOLE GC/CHLAMYDIA
Chlamydia: NEGATIVE
Neisseria Gonorrhea: NEGATIVE

## 2022-07-20 ENCOUNTER — Telehealth (HOSPITAL_COMMUNITY): Payer: Self-pay | Admitting: *Deleted

## 2022-07-20 ENCOUNTER — Encounter (HOSPITAL_COMMUNITY): Payer: Self-pay | Admitting: *Deleted

## 2022-07-20 ENCOUNTER — Encounter (HOSPITAL_COMMUNITY): Payer: Self-pay

## 2022-07-20 NOTE — Telephone Encounter (Signed)
Preadmission screen  

## 2022-07-20 NOTE — Patient Instructions (Signed)
Brianna Bell  07/20/2022   Your procedure is scheduled on:  08/03/2022  Arrive at [place] at Entrance C on [address] at [facility]  and [address]. You are invited to use the FREE valet parking or use the Visitor's parking deck.  Pick up the phone at the desk and dial [phone].  Call this number if you have problems the morning of surgery: [phone]  Remember:   Do not eat food:(After Midnight) Desps de medianoche.  Do not drink clear liquids: (After Midnight) Desps de medianoche.  Take these medicines the morning of surgery with A SIP OF WATER:  none   Do not wear jewelry, make-up or nail polish.  Do not wear lotions, powders, or perfumes. Do not wear deodorant.  Do not shave 48 hours prior to surgery.  Do not bring valuables to the hospital.  [facility] is not   responsible for any belongings or valuables brought to the hospital.  Contacts, dentures or bridgework may not be worn into surgery.  Leave suitcase in the car. After surgery it may be brought to your room.  For patients admitted to the hospital, checkout time is 11:00 AM the day of              discharge.      Please read over the following fact sheets that you were given:     Preparing for Surgery

## 2022-07-21 ENCOUNTER — Encounter (HOSPITAL_COMMUNITY): Payer: Self-pay

## 2022-07-26 LAB — OB RESULTS CONSOLE GBS: GBS: NEGATIVE

## 2022-07-31 ENCOUNTER — Encounter (HOSPITAL_COMMUNITY)
Admission: RE | Admit: 2022-07-31 | Discharge: 2022-07-31 | Disposition: A | Discharge status: Home / Self Care | Payer: 59 | Source: Ambulatory Visit | Attending: Obstetrics & Gynecology

## 2022-07-31 DIAGNOSIS — Z01812 Encounter for preprocedural laboratory examination: Secondary | ICD-10-CM | POA: Insufficient documentation

## 2022-07-31 DIAGNOSIS — Z98891 History of uterine scar from previous surgery: Secondary | ICD-10-CM | POA: Insufficient documentation

## 2022-07-31 LAB — CBC
HCT: 32.1 % — ABNORMAL LOW (ref 36.0–46.0)
Hemoglobin: 10.6 g/dL — ABNORMAL LOW (ref 12.0–15.0)
MCH: 27.1 pg (ref 26.0–34.0)
MCHC: 33 g/dL (ref 30.0–36.0)
MCV: 82.1 fL (ref 80.0–100.0)
Platelets: 305 10*3/uL (ref 150–400)
RBC: 3.91 MIL/uL (ref 3.87–5.11)
RDW: 13.7 % (ref 11.5–15.5)
WBC: 8.8 10*3/uL (ref 4.0–10.5)
nRBC: 0 % (ref 0.0–0.2)

## 2022-07-31 LAB — TYPE AND SCREEN
ABO/RH(D): O POS
Antibody Screen: NEGATIVE

## 2022-07-31 NOTE — H&P (Signed)
Brianna Bell is a 37 y.o. female [phone] at 35 weeks presenting for repeat C/S; previous x 2.  Antepartum course complicated by anxiety; taking fluoxetine 20 mg.  This pregnancy complicated by vanishing twin; low risk MaterniT21.  GBS negative.  OB History     Gravida  4   Para  2   Term  2   Preterm      AB  1   Living  2      SAB      IAB  1   Ectopic      Multiple  0   Live Births  2          Past Medical History:  Diagnosis Date   Anxiety    Past Surgical History:  Procedure Laterality Date   CESAREAN SECTION N/A 11/12/2017   Procedure: CESAREAN SECTION;  Surgeon: Linda Hedges, DO;  Location: [place];  Service: Obstetrics;  Laterality: N/A;   CESAREAN SECTION N/A 01/07/2021   Procedure: REPEAT CESAREAN SECTION EDC: 01-13-21 ALLERG: NKDA;  Surgeon: Dian Queen, MD;  Location: [facility] ORS;  Service: Obstetrics;  Laterality: N/A;   DILATION AND EVACUATION N/A 04/23/2017   Procedure: DILATATION AND EVACUATION with ultrasound guidance;  Surgeon: Dian Queen, MD;  Location: [place] [facility];  Service: Gynecology;  Laterality: N/A;   DILATION AND EVACUATION N/A 06/27/2019   Procedure: DILATATION AND EVACUATION;  Surgeon: Linda Hedges, DO;  Location: [place];  Service: Gynecology;  Laterality: N/A;   Family History: family history includes Diabetes in her mother; Hypertension in her father and mother; Hypothyroidism in her mother; Sarcoidosis in her mother. Social History:  reports that she has never smoked. She has never used smokeless tobacco. She reports that she does not currently use alcohol. She reports that she does not use drugs.     Maternal Diabetes: No Genetic Screening: Normal Maternal Ultrasounds/Referrals: Normal Fetal Ultrasounds or other Referrals:  None Maternal Substance Abuse:  No Significant Maternal Medications:  Meds include: Other: fluoxetine Significant Maternal Lab Results:  Group B Strep negative Number of  Prenatal Visits:greater than 3 verified prenatal visits Other Comments:  None  Review of Systems Maternal Medical History:  Prenatal complications: no prenatal complications Prenatal Complications - Diabetes: none.     unknown if currently breastfeeding. Maternal Exam:  Abdomen: Patient reports no abdominal tenderness. Surgical scars: low transverse.   Fundal height is c/w dates.   Estimated fetal weight is 8#.     Physical Exam Constitutional:      Appearance: Normal appearance.  HENT:     Head: Normocephalic and atraumatic.  Pulmonary:     Effort: Pulmonary effort is normal.  Abdominal:     Palpations: Abdomen is soft.  Musculoskeletal:        General: Normal range of motion.     Cervical back: Normal range of motion.  Skin:    General: Skin is warm and dry.  Neurological:     Mental Status: She is alert and oriented to person, place, and time.  Psychiatric:        Mood and Affect: Mood normal.        Behavior: Behavior normal.     Prenatal labs: ABO, Rh: O/Positive/-- (06/26 0000) Antibody: Negative (06/26 0000) Rubella: Immune (06/26 0000) RPR: Nonreactive (06/26 0000)  HBsAg: Negative (06/26 0000)  HIV: Non-reactive (06/26 0000)  GBS:   Negative  Assessment/Plan: 36yo [id number] at 39 weeks for repeat C/S Patient is counseled re: risk of  bleeding, infection, scarring and damage to surrounding structures.  She is informed of implications in future pregnancies to include abnormal placentation and uterine rupture.  All questions were answered and patient wishes to proceed.   Linda Hedges 07/31/2022, 6:52 AM

## 2022-08-01 LAB — RPR: RPR Ser Ql: NONREACTIVE

## 2022-08-02 ENCOUNTER — Encounter (HOSPITAL_COMMUNITY): Payer: Self-pay | Admitting: Obstetrics & Gynecology

## 2022-08-03 ENCOUNTER — Encounter (HOSPITAL_COMMUNITY): Payer: Self-pay | Admitting: Obstetrics & Gynecology

## 2022-08-03 ENCOUNTER — Encounter (HOSPITAL_COMMUNITY)
Admission: RE | Disposition: A | Discharge status: Home / Self Care | Payer: Self-pay | Source: Home / Self Care | Attending: Obstetrics & Gynecology

## 2022-08-03 ENCOUNTER — Inpatient Hospital Stay (HOSPITAL_COMMUNITY)
Admission: RE | Admit: 2022-08-03 | Discharge: 2022-08-04 | DRG: 788 | Disposition: A | Discharge status: Home / Self Care | Payer: 59 | Attending: Obstetrics & Gynecology | Admitting: Obstetrics & Gynecology

## 2022-08-03 ENCOUNTER — Other Ambulatory Visit: Payer: Self-pay

## 2022-08-03 ENCOUNTER — Inpatient Hospital Stay (HOSPITAL_COMMUNITY): Payer: 59 | Admitting: Anesthesiology

## 2022-08-03 DIAGNOSIS — O34211 Maternal care for low transverse scar from previous cesarean delivery: Secondary | ICD-10-CM | POA: Diagnosis present

## 2022-08-03 DIAGNOSIS — O99344 Other mental disorders complicating childbirth: Secondary | ICD-10-CM | POA: Diagnosis present

## 2022-08-03 DIAGNOSIS — Z98891 History of uterine scar from previous surgery: Secondary | ICD-10-CM

## 2022-08-03 DIAGNOSIS — O99214 Obesity complicating childbirth: Secondary | ICD-10-CM | POA: Diagnosis not present

## 2022-08-03 DIAGNOSIS — F419 Anxiety disorder, unspecified: Secondary | ICD-10-CM | POA: Diagnosis present

## 2022-08-03 DIAGNOSIS — L910 Hypertrophic scar: Secondary | ICD-10-CM | POA: Diagnosis present

## 2022-08-03 DIAGNOSIS — O9972 Diseases of the skin and subcutaneous tissue complicating childbirth: Secondary | ICD-10-CM | POA: Diagnosis present

## 2022-08-03 DIAGNOSIS — Z3A39 39 weeks gestation of pregnancy: Secondary | ICD-10-CM

## 2022-08-03 DIAGNOSIS — E669 Obesity, unspecified: Secondary | ICD-10-CM | POA: Diagnosis not present

## 2022-08-03 LAB — HIV ANTIBODY (ROUTINE TESTING W REFLEX): HIV Screen 4th Generation wRfx: NONREACTIVE

## 2022-08-03 SURGERY — Surgical Case
Anesthesia: Spinal | Site: Abdomen

## 2022-08-03 MED ORDER — KETOROLAC TROMETHAMINE 30 MG/ML IJ SOLN: 30.0000 mg | Freq: Four times a day (QID) | INTRAMUSCULAR | Status: DC | PRN

## 2022-08-03 MED ORDER — BUPIVACAINE IN DEXTROSE 0.75-8.25 % IT SOLN
INTRATHECAL | Status: DC | PRN
  Administered 2022-08-03: 1.4 mL via INTRATHECAL

## 2022-08-03 MED ORDER — ZOLPIDEM TARTRATE 5 MG PO TABS: 5.0000 mg | ORAL_TABLET | Freq: Every evening | ORAL | Status: DC | PRN

## 2022-08-03 MED ORDER — FENTANYL CITRATE (PF) 100 MCG/2ML IJ SOLN
INTRAMUSCULAR | Status: AC
  Filled 2022-08-03: qty 2

## 2022-08-03 MED ORDER — PHENYLEPHRINE 80 MCG/ML (10ML) SYRINGE FOR IV PUSH (FOR BLOOD PRESSURE SUPPORT)
PREFILLED_SYRINGE | INTRAVENOUS | Status: AC
  Filled 2022-08-03: qty 40

## 2022-08-03 MED ORDER — NALOXONE HCL 4 MG/10ML IJ SOLN: 1.0000 ug/kg/h | INTRAVENOUS | Status: DC | PRN

## 2022-08-03 MED ORDER — FERROUS SULFATE 325 (65 FE) MG PO TABS
325.0000 mg | ORAL_TABLET | Freq: Every day | ORAL | Status: DC
  Administered 2022-08-04: 325 mg via ORAL
  Filled 2022-08-03: qty 1

## 2022-08-03 MED ORDER — ONDANSETRON HCL 4 MG/2ML IJ SOLN: 4.0000 mg | Freq: Three times a day (TID) | INTRAMUSCULAR | Status: DC | PRN

## 2022-08-03 MED ORDER — ACETAMINOPHEN 500 MG PO TABS: 1000.0000 mg | ORAL_TABLET | Freq: Four times a day (QID) | ORAL | Status: DC

## 2022-08-03 MED ORDER — NALOXONE HCL 0.4 MG/ML IJ SOLN: 0.4000 mg | INTRAMUSCULAR | Status: DC | PRN

## 2022-08-03 MED ORDER — DEXAMETHASONE SODIUM PHOSPHATE 10 MG/ML IJ SOLN
INTRAMUSCULAR | Status: AC
  Filled 2022-08-03: qty 1

## 2022-08-03 MED ORDER — OXYTOCIN-SODIUM CHLORIDE 30-0.9 UT/500ML-% IV SOLN
2.5000 [IU]/h | INTRAVENOUS | Status: AC
  Administered 2022-08-03: 2.5 [IU]/h via INTRAVENOUS
  Filled 2022-08-03: qty 500

## 2022-08-03 MED ORDER — PRENATAL MULTIVITAMIN CH
1.0000 | ORAL_TABLET | Freq: Every day | ORAL | Status: DC
  Administered 2022-08-03 – 2022-08-04 (×2): 1 via ORAL
  Filled 2022-08-03 (×2): qty 1

## 2022-08-03 MED ORDER — MEPERIDINE HCL 25 MG/ML IJ SOLN: 6.2500 mg | INTRAMUSCULAR | Status: DC | PRN

## 2022-08-03 MED ORDER — DIBUCAINE (PERIANAL) 1 % EX OINT: 1.0000 | TOPICAL_OINTMENT | CUTANEOUS | Status: DC | PRN

## 2022-08-03 MED ORDER — DEXAMETHASONE SODIUM PHOSPHATE 10 MG/ML IJ SOLN
INTRAMUSCULAR | Status: DC | PRN
  Administered 2022-08-03: 10 mg via INTRAVENOUS

## 2022-08-03 MED ORDER — SOD CITRATE-CITRIC ACID 500-334 MG/5ML PO SOLN
ORAL | Status: AC
  Filled 2022-08-03: qty 30

## 2022-08-03 MED ORDER — IBUPROFEN 600 MG PO TABS
600.0000 mg | ORAL_TABLET | Freq: Four times a day (QID) | ORAL | Status: DC
  Administered 2022-08-03 – 2022-08-04 (×3): 600 mg via ORAL
  Filled 2022-08-03 (×3): qty 1

## 2022-08-03 MED ORDER — DIPHENHYDRAMINE HCL 25 MG PO CAPS: 25.0000 mg | ORAL_CAPSULE | Freq: Four times a day (QID) | ORAL | Status: DC | PRN

## 2022-08-03 MED ORDER — LACTATED RINGERS IV SOLN: INTRAVENOUS | Status: DC

## 2022-08-03 MED ORDER — HYDROMORPHONE HCL 1 MG/ML IJ SOLN: 0.2500 mg | INTRAMUSCULAR | Status: DC | PRN

## 2022-08-03 MED ORDER — OXYCODONE HCL 5 MG PO TABS: 5.0000 mg | ORAL_TABLET | Freq: Four times a day (QID) | ORAL | Status: DC | PRN

## 2022-08-03 MED ORDER — CEFAZOLIN SODIUM-DEXTROSE 2-4 GM/100ML-% IV SOLN
2.0000 g | INTRAVENOUS | Status: AC
  Administered 2022-08-03: 2 g via INTRAVENOUS

## 2022-08-03 MED ORDER — DIPHENHYDRAMINE HCL 50 MG/ML IJ SOLN: 12.5000 mg | INTRAMUSCULAR | Status: DC | PRN

## 2022-08-03 MED ORDER — STERILE WATER FOR IRRIGATION IR SOLN
Status: DC | PRN
  Administered 2022-08-03: 1000 mL

## 2022-08-03 MED ORDER — COCONUT OIL OIL: 1.0000 | TOPICAL_OIL | Status: DC | PRN

## 2022-08-03 MED ORDER — PHENYLEPHRINE HCL (PRESSORS) 10 MG/ML IV SOLN
INTRAVENOUS | Status: DC | PRN
  Administered 2022-08-03 (×3): 160 ug via INTRAVENOUS
  Administered 2022-08-03: 80 ug via INTRAVENOUS

## 2022-08-03 MED ORDER — MORPHINE SULFATE (PF) 0.5 MG/ML IJ SOLN
INTRAMUSCULAR | Status: DC | PRN
  Administered 2022-08-03: 150 ug via INTRATHECAL

## 2022-08-03 MED ORDER — ATROPINE SULFATE 0.4 MG/ML IV SOLN
INTRAVENOUS | Status: DC | PRN
  Administered 2022-08-03: .4 mg via INTRAVENOUS

## 2022-08-03 MED ORDER — EPHEDRINE SULFATE (PRESSORS) 50 MG/ML IJ SOLN
INTRAMUSCULAR | Status: DC | PRN
  Administered 2022-08-03 (×3): 5 mg via INTRAVENOUS

## 2022-08-03 MED ORDER — SODIUM CHLORIDE 0.9% FLUSH: 3.0000 mL | INTRAVENOUS | Status: DC | PRN

## 2022-08-03 MED ORDER — SODIUM CHLORIDE 0.9 % IR SOLN
Status: DC | PRN
  Administered 2022-08-03: 1000 mL

## 2022-08-03 MED ORDER — MORPHINE SULFATE (PF) 0.5 MG/ML IJ SOLN
INTRAMUSCULAR | Status: AC
  Filled 2022-08-03: qty 10

## 2022-08-03 MED ORDER — KETOROLAC TROMETHAMINE 30 MG/ML IJ SOLN
INTRAMUSCULAR | Status: AC
  Filled 2022-08-03: qty 1

## 2022-08-03 MED ORDER — CEFAZOLIN SODIUM-DEXTROSE 2-4 GM/100ML-% IV SOLN
INTRAVENOUS | Status: AC
  Filled 2022-08-03: qty 100

## 2022-08-03 MED ORDER — DIPHENHYDRAMINE HCL 25 MG PO CAPS: 25.0000 mg | ORAL_CAPSULE | ORAL | Status: DC | PRN

## 2022-08-03 MED ORDER — PROMETHAZINE HCL 25 MG/ML IJ SOLN: 6.2500 mg | INTRAMUSCULAR | Status: DC | PRN

## 2022-08-03 MED ORDER — SIMETHICONE 80 MG PO CHEW
80.0000 mg | CHEWABLE_TABLET | Freq: Three times a day (TID) | ORAL | Status: DC
  Administered 2022-08-03 – 2022-08-04 (×3): 80 mg via ORAL
  Filled 2022-08-03 (×3): qty 1

## 2022-08-03 MED ORDER — KETOROLAC TROMETHAMINE 30 MG/ML IJ SOLN
30.0000 mg | Freq: Once | INTRAMUSCULAR | Status: AC | PRN
  Administered 2022-08-03: 30 mg via INTRAVENOUS

## 2022-08-03 MED ORDER — ONDANSETRON HCL 4 MG/2ML IJ SOLN
INTRAMUSCULAR | Status: AC
  Filled 2022-08-03: qty 2

## 2022-08-03 MED ORDER — SCOPOLAMINE 1 MG/3DAYS TD PT72: 1.0000 | MEDICATED_PATCH | Freq: Once | TRANSDERMAL | Status: DC

## 2022-08-03 MED ORDER — ONDANSETRON HCL 4 MG/2ML IJ SOLN
INTRAMUSCULAR | Status: DC | PRN
  Administered 2022-08-03: 4 mg via INTRAVENOUS

## 2022-08-03 MED ORDER — SENNOSIDES-DOCUSATE SODIUM 8.6-50 MG PO TABS
2.0000 | ORAL_TABLET | Freq: Every day | ORAL | Status: DC
  Administered 2022-08-04: 2 via ORAL
  Filled 2022-08-03: qty 2

## 2022-08-03 MED ORDER — PHENYLEPHRINE HCL-NACL 20-0.9 MG/250ML-% IV SOLN
INTRAVENOUS | Status: DC | PRN
  Administered 2022-08-03: 60 ug/min via INTRAVENOUS

## 2022-08-03 MED ORDER — OXYCODONE HCL 5 MG PO TABS: 5.0000 mg | ORAL_TABLET | ORAL | Status: DC | PRN

## 2022-08-03 MED ORDER — ACETAMINOPHEN 10 MG/ML IV SOLN
INTRAVENOUS | Status: DC | PRN
  Administered 2022-08-03: 1000 mg via INTRAVENOUS

## 2022-08-03 MED ORDER — SIMETHICONE 80 MG PO CHEW: 80.0000 mg | CHEWABLE_TABLET | ORAL | Status: DC | PRN

## 2022-08-03 MED ORDER — POVIDONE-IODINE 10 % EX SWAB
2.0000 | Freq: Once | CUTANEOUS | Status: AC
  Administered 2022-08-03: 2 via TOPICAL

## 2022-08-03 MED ORDER — FENTANYL CITRATE (PF) 100 MCG/2ML IJ SOLN
INTRAMUSCULAR | Status: DC | PRN
  Administered 2022-08-03: 15 ug via INTRATHECAL

## 2022-08-03 MED ORDER — ATROPINE SULFATE 0.4 MG/ML IV SOLN
INTRAVENOUS | Status: AC
  Filled 2022-08-03: qty 1

## 2022-08-03 MED ORDER — OXYTOCIN-SODIUM CHLORIDE 30-0.9 UT/500ML-% IV SOLN
INTRAVENOUS | Status: DC | PRN
  Administered 2022-08-03: 300 mL via INTRAVENOUS

## 2022-08-03 MED ORDER — MENTHOL 3 MG MT LOZG: 1.0000 | LOZENGE | OROMUCOSAL | Status: DC | PRN

## 2022-08-03 MED ORDER — SERTRALINE HCL 50 MG PO TABS
50.0000 mg | ORAL_TABLET | Freq: Every day | ORAL | Status: DC
  Administered 2022-08-04: 50 mg via ORAL
  Filled 2022-08-03 (×2): qty 1

## 2022-08-03 MED ORDER — WITCH HAZEL-GLYCERIN EX PADS: 1.0000 | MEDICATED_PAD | CUTANEOUS | Status: DC | PRN

## 2022-08-03 MED ORDER — ACETAMINOPHEN 500 MG PO TABS
1000.0000 mg | ORAL_TABLET | Freq: Four times a day (QID) | ORAL | Status: DC
  Administered 2022-08-03 – 2022-08-04 (×3): 1000 mg via ORAL
  Filled 2022-08-03 (×4): qty 2

## 2022-08-03 SURGICAL SUPPLY — 34 items
ADH SKN CLS APL DERMABOND .7 (GAUZE/BANDAGES/DRESSINGS)
APL PRP STRL LF DISP 70% ISPRP (MISCELLANEOUS) ×2
APL SKNCLS STERI-STRIP NONHPOA (GAUZE/BANDAGES/DRESSINGS) ×1
BENZOIN TINCTURE PRP APPL 2/3 (GAUZE/BANDAGES/DRESSINGS) ×1 IMPLANT
CHLORAPREP W/TINT 26 (MISCELLANEOUS) ×2 IMPLANT
CLAMP UMBILICAL CORD (MISCELLANEOUS) ×1 IMPLANT
CLOTH BEACON ORANGE TIMEOUT ST (SAFETY) ×1 IMPLANT
DERMABOND ADVANCED .7 DNX12 (GAUZE/BANDAGES/DRESSINGS) IMPLANT
DRSG OPSITE POSTOP 4X10 (GAUZE/BANDAGES/DRESSINGS) ×1 IMPLANT
ELECT REM PT RETURN 9FT ADLT (ELECTROSURGICAL) ×1
ELECTRODE REM PT RTRN 9FT ADLT (ELECTROSURGICAL) ×1 IMPLANT
EXTRACTOR VACUUM KIWI (MISCELLANEOUS) IMPLANT
GAUZE SPONGE 4X4 12PLY STRL LF (GAUZE/BANDAGES/DRESSINGS) IMPLANT
GLOVE BIO SURGEON STRL SZ 6 (GLOVE) ×1 IMPLANT
GLOVE BIOGEL PI IND STRL 6 (GLOVE) ×2 IMPLANT
GLOVE BIOGEL PI IND STRL 7.0 (GLOVE) ×1 IMPLANT
GOWN STRL REUS W/TWL LRG LVL3 (GOWN DISPOSABLE) ×2 IMPLANT
KIT ABG SYR 3ML LUER SLIP (SYRINGE) ×1 IMPLANT
NDL HYPO 25X5/8 SAFETYGLIDE (NEEDLE) ×1 IMPLANT
NEEDLE HYPO 25X5/8 SAFETYGLIDE (NEEDLE) ×1 IMPLANT
NS IRRIG 1000ML POUR BTL (IV SOLUTION) ×1 IMPLANT
PACK C SECTION WH (CUSTOM PROCEDURE TRAY) ×1 IMPLANT
PAD ABD 7.5X8 STRL (GAUZE/BANDAGES/DRESSINGS) IMPLANT
PAD OB MATERNITY 4.3X12.25 (PERSONAL CARE ITEMS) ×1 IMPLANT
STRIP CLOSURE SKIN 1/2X4 (GAUZE/BANDAGES/DRESSINGS) IMPLANT
SUT CHROMIC 0 CTX 36 (SUTURE) ×3 IMPLANT
SUT MON AB 2-0 CT1 27 (SUTURE) ×1 IMPLANT
SUT PDS AB 0 CT1 27 (SUTURE) IMPLANT
SUT PLAIN 0 NONE (SUTURE) IMPLANT
SUT VIC AB 0 CT1 36 (SUTURE) IMPLANT
SUT VIC AB 4-0 KS 27 (SUTURE) IMPLANT
TOWEL OR 17X24 6PK STRL BLUE (TOWEL DISPOSABLE) ×1 IMPLANT
TRAY FOLEY W/BAG SLVR 14FR LF (SET/KITS/TRAYS/PACK) IMPLANT
WATER STERILE IRR 1000ML POUR (IV SOLUTION) ×1 IMPLANT

## 2022-08-03 NOTE — Op Note (Signed)
Brianna Bell PROCEDURE DATE: 08/03/2022  PREOPERATIVE DIAGNOSIS: Intrauterine pregnancy at  [redacted]w[redacted]d weeks gestation, previous C/S x 2  POSTOPERATIVE DIAGNOSIS: The same  PROCEDURE:  Repeat Low Transverse Cesarean Section  SURGEON:  Dr. Linda Hedges  INDICATIONS: Brianna Bell is a 37 y.o. [id number] at [redacted]w[redacted]d scheduled for cesarean section secondary to previous C/S x 2.  The risks of cesarean section discussed with the patient included but were not limited to: bleeding which may require transfusion or reoperation; infection which may require antibiotics; injury to bowel, bladder, ureters or other surrounding organs; injury to the fetus; need for additional procedures including hysterectomy in the event of a life-threatening hemorrhage; placental abnormalities wth subsequent pregnancies, incisional problems, thromboembolic phenomenon and other postoperative/anesthesia complications. The patient concurred with the proposed plan, giving informed written consent for the procedure.    FINDINGS:  Viable female infant in cephalic presentation, APGARs 9,9: weight pending  Clear amniotic fluid.  Intact placenta, three vessel cord.  Grossly normal uterus, ovaries and fallopian tubes. .   ANESTHESIA:  Spinal ESTIMATED BLOOD LOSS: 370 ml SPECIMENS: Placenta sent to L&D COMPLICATIONS: None immediate  PROCEDURE IN DETAIL:  The patient received intravenous antibiotics and had sequential compression devices applied to her lower extremities while in the preoperative area.  She was then taken to the operating room where spinal anesthesia was administered and was found to be adequate. She was then placed in a dorsal supine position with a leftward tilt, and prepped and draped in a sterile manner.  A foley catheter was placed into her bladder and attached to constant gravity.  After an adequate timeout was performed, a Pfannenstiel skin incision was made with scalpel to remove keloid.  This incision was carried through  to the underlying layer of fascia. The fascia was incised in the midline and this incision was extended bilaterally using the Mayo scissors. Kocher clamps were applied to the superior aspect of the fascial incision and the underlying rectus muscles were dissected off bluntly. A similar process was carried out on the inferior aspect of the facial incision. The rectus muscles were separated in the midline bluntly and the peritoneum was entered bluntly.   A transverse hysterotomy was made with a scalpel and extended bilaterally bluntly. The bladder blade was then removed. The infant was successfully delivered, and cord was clamped and cut and infant was handed over to awaiting neonatology team. Uterine massage was then administered and the placenta delivered intact with three-vessel cord. The uterus was cleared of clot and debris.  The hysterotomy was closed with 0 chromic.  A second imbricating suture of 0-chromic was used to reinforce the incision and aid in hemostasis.  The peritoneum and rectus muscles were noted to be hemostatic and were reapproximated using 2-0 monocryl in a running fashion.  The fascia was closed with 0-PDS in a running fashion with good restoration of anatomy.  The subcutaneus tissue was copiously irrigated.  The skin was closed with 4-0 vicryl in a subcuticular fashion.  Pt tolerated the procedure will.  All counts were correct x2.  Pt went to the recovery room in stable condition.

## 2022-08-03 NOTE — Transfer of Care (Signed)
Immediate Anesthesia Transfer of Care Note  Patient: Brianna Bell  Procedure(s) Performed: REPEAT CESAREAN SECTION EDC: 08-10-22 ALLERG: NKDA  PREVIOUS X 2 (Abdomen)  Patient Location: PACU  Anesthesia Type:Spinal  Level of Consciousness: awake, alert , and oriented  Airway & Oxygen Therapy: Patient Spontanous Breathing  Post-op Assessment: Report given to RN and Post -op Vital signs reviewed and stable  Post vital signs: Reviewed and stable  Last Vitals:  Vitals Value Taken Time  BP 96/58 08/03/22 0847  Temp    Pulse 76 08/03/22 0850  Resp 19 08/03/22 0850  SpO2 99 % 08/03/22 0850  Vitals shown include unvalidated device data.  Last Pain:  Vitals:   08/03/22 0518  TempSrc: Oral  PainSc: 0-No pain         Complications: No notable events documented.

## 2022-08-03 NOTE — Progress Notes (Signed)
No change to H&P.  Brianna Meenan, DO 

## 2022-08-03 NOTE — Lactation Note (Signed)
This note was copied from a baby's chart. Lactation Consultation Note  Patient Name: Brianna Bell [id number] Date: 08/03/2022 Reason for consult: Initial assessment;Term Age:36 hours   P3: Term infant at 39+0 weeks Feeding preference: Breast  Mother is an experienced breast feeding mother.  She is planning on going back to work at approximately 6 weeks and will transition to formula at that time.  She had no questions/concerns related to breast feeding and denies the need for any future lactation consults.  Support person present.     Maternal Data    Feeding Mother's Current Feeding Choice: Breast Milk  LATCH Score Latch: Grasps breast easily, tongue down, lips flanged, rhythmical sucking.  Audible Swallowing: A few with stimulation  Type of Nipple: Everted at rest and after stimulation  Comfort (Breast/Nipple): Soft / non-tender  Hold (Positioning): No assistance needed to correctly position infant at breast.  LATCH Score: 9   Lactation Tools Discussed/Used    Interventions    Discharge Pump: Personal  Consult Status Consult Status: Complete (mother declined follow up)    [name] 08/03/2022, 3:50 PM

## 2022-08-03 NOTE — Anesthesia Procedure Notes (Signed)
Spinal  Patient location during procedure: OR Start time: 08/03/2022 7:29 AM End time: 08/03/2022 7:31 AM Reason for block: surgical anesthesia Staffing Performed: anesthesiologist  Anesthesiologist: Lyn Hollingshead, MD Performed by: Lyn Hollingshead, MD Authorized by: Lyn Hollingshead, MD   Preanesthetic Checklist Completed: patient identified, IV checked, site marked, risks and benefits discussed, surgical consent, monitors and equipment checked, pre-op evaluation and timeout performed Spinal Block Patient position: sitting Prep: DuraPrep and site prepped and draped Patient monitoring: continuous pulse ox and blood pressure Approach: midline Location: L3-4 Injection technique: single-shot Needle Needle type: Pencan  Needle gauge: 24 G Needle length: 10 cm Needle insertion depth: 5 cm Assessment Sensory level: T4 Events: CSF return

## 2022-08-03 NOTE — Anesthesia Preprocedure Evaluation (Signed)
Anesthesia Evaluation  Patient identified by MRN, date of birth, ID band Patient awake    Reviewed: Allergy & Precautions, H&P , NPO status , Patient's Chart, lab work & pertinent test results  Airway Mallampati: I  TM Distance: >3 FB Neck ROM: full    Dental no notable dental hx.    Pulmonary neg pulmonary ROS   Pulmonary exam normal        Cardiovascular negative cardio ROS Normal cardiovascular exam     Neuro/Psych  PSYCHIATRIC DISORDERS Anxiety     negative neurological ROS     GI/Hepatic negative GI ROS, Neg liver ROS,,,  Endo/Other  negative endocrine ROS    Renal/GU negative Renal ROS  negative genitourinary   Musculoskeletal negative musculoskeletal ROS (+)    Abdominal  (+) + obese  Peds  Hematology negative hematology ROS (+)   Anesthesia Other Findings   Reproductive/Obstetrics (+) Pregnancy                             Anesthesia Physical Anesthesia Plan  ASA: 2  Anesthesia Plan: Spinal   Post-op Pain Management:    Induction:   PONV Risk Score and Plan: 3 and Ondansetron, Dexamethasone and Scopolamine patch - Pre-op  Airway Management Planned: Natural Airway, Nasal Cannula and Simple Face Mask  Additional Equipment: None  Intra-op Plan:   Post-operative Plan:   Informed Consent: I have reviewed the patients History and Physical, chart, labs and discussed the procedure including the risks, benefits and alternatives for the proposed anesthesia with the patient or authorized representative who has indicated his/her understanding and acceptance.       Plan Discussed with: CRNA  Anesthesia Plan Comments:         Anesthesia Quick Evaluation

## 2022-08-04 LAB — CBC
HCT: 27.2 % — ABNORMAL LOW (ref 36.0–46.0)
Hemoglobin: 9 g/dL — ABNORMAL LOW (ref 12.0–15.0)
MCH: 27.7 pg (ref 26.0–34.0)
MCHC: 33.1 g/dL (ref 30.0–36.0)
MCV: 83.7 fL (ref 80.0–100.0)
Platelets: 247 K/uL (ref 150–400)
RBC: 3.25 MIL/uL — ABNORMAL LOW (ref 3.87–5.11)
RDW: 14.1 % (ref 11.5–15.5)
WBC: 13.4 K/uL — ABNORMAL HIGH (ref 4.0–10.5)
nRBC: 0 % (ref 0.0–0.2)

## 2022-08-04 MED ORDER — IBUPROFEN 600 MG PO TABS: 600.0000 mg | ORAL_TABLET | Freq: Four times a day (QID) | ORAL | 0 refills | Status: AC | PRN

## 2022-08-04 NOTE — Progress Notes (Signed)
Subjective: Postpartum Day 1: Cesarean Delivery Patient reports incisional pain, tolerating PO, and no problems voiding.    Objective: Vital signs in last 24 hours: Temp:  [97.7 F (36.5 C)-98.6 F (37 C)] 98.3 F (36.8 C) (01/30 0556) Pulse Rate:  [55-78] 76 (01/30 0556) Resp:  [15-20] 17 (01/30 0556) BP: (92-105)/(47-62) 100/54 (01/30 0556) SpO2:  [96 %-100 %] 99 % (01/30 0556)  Physical Exam:  General: alert, cooperative, appears stated age, and no distress Lochia: appropriate Uterine Fundus: firm Incision: bandage dry DVT Evaluation: No evidence of DVT seen on physical exam.  Recent Labs    08/04/22 0545  HGB 9.0*  HCT 27.2*    Assessment/Plan: Status post Cesarean section. Doing well postoperatively. Desires circ   Wants d/c today Discharge home with standard precautions and return to clinic in 4-6 weeks.  Luz Lex, MD 08/04/2022, 9:01 AM

## 2022-08-04 NOTE — Social Work (Signed)
MOB was referred for history of depression/anxiety.  * Referral screened out by Clinical Social Worker because none of the following criteria appear to apply:  ~ History of anxiety/depression during this pregnancy, or of post-partum depression following prior delivery.  ~ Diagnosis of anxiety and/or depression within last 3 years OR * MOB's symptoms currently being treated with medication and/or therapy. Per OB records MOB symptoms controlled well on Zoloft.  Please contact the Clinical Social Worker if needs arise or by MOB request.  Brianna Bell, [place] Social Worker [phone]

## 2022-08-04 NOTE — Discharge Summary (Signed)
Postpartum Discharge Summary       Patient Name: Brianna Bell DOB: January 04, 1986 MRN: [id number]  Date of admission: 08/03/2022 Delivery date:08/03/2022  Delivering provider: Linda Hedges  Date of discharge: 08/04/2022  Admitting diagnosis: Previous cesarean section [Z98.891] S/P cesarean section [Z98.891] Intrauterine pregnancy: [redacted]w[redacted]d     Secondary diagnosis:  Principal Problem:   Previous cesarean section Active Problems:   S/P cesarean section  Additional problems:      Discharge diagnosis: Term Pregnancy Delivered                                              Post partum procedures:   Augmentation: N/A Complications: None  Hospital course: Sceduled C/S   37 y.o. yo [id number] at [redacted]w[redacted]d was admitted to the hospital 08/03/2022 for scheduled cesarean section with the following indication:Elective Repeat.Delivery details are as follows:  Membrane Rupture Time/Date: 7:53 AM ,08/03/2022   Delivery Method:C-Section, Low Transverse  Details of operation can be found in separate operative note.  Patient had a postpartum course uncomplicated.  She is ambulating, tolerating a regular diet, passing flatus, and urinating well. Patient is discharged home in stable condition on  08/04/22        Newborn Data: Birth date:08/03/2022  Birth time:7:54 AM  Gender:Female  Living status:Living  Apgars:9 ,9  Weight:3570 g     Magnesium Sulfate received: No BMZ received: No Rhophylac:N/A MMR:N/A T-DaP:Given prenatally Flu: N/A Transfusion:No  Physical exam  Vitals:   08/04/22 0020 08/04/22 0034 08/04/22 0556 08/04/22 0600  BP: (!) 97/55 (!) 97/55 (!) 100/54   Pulse: 73 73 76   Resp:  17 17   Temp: 98.6 F (37 C) 98.6 F (37 C) 98.3 F (36.8 C)   TempSrc:    Oral  SpO2: 99% 99% 99%   Weight:      Height:       General: alert, cooperative, and no distress Lochia: appropriate Uterine Fundus: firm Incision: bandage dry DVT Evaluation: No evidence of DVT seen on physical  exam. Labs: Lab Results  Component Value Date   WBC 13.4 (H) 08/04/2022   HGB 9.0 (L) 08/04/2022   HCT 27.2 (L) 08/04/2022   MCV 83.7 08/04/2022   PLT 247 08/04/2022      Latest Ref Rng & Units 12/10/2020    1:34 PM  CMP  Glucose 70 - 99 mg/dL 104   BUN 6 - 20 mg/dL 6   Creatinine 0.44 - 1.00 mg/dL 0.61   Sodium 135 - 145 mmol/L 133   Potassium 3.5 - 5.1 mmol/L 3.8   Chloride 98 - 111 mmol/L 104   CO2 22 - 32 mmol/L 23   Calcium 8.9 - 10.3 mg/dL 8.6   Total Protein 6.5 - 8.1 g/dL 6.0   Total Bilirubin 0.3 - 1.2 mg/dL 0.4   Alkaline Phos 38 - 126 U/L 99   AST 15 - 41 U/L 19   ALT 0 - 44 U/L 14    Edinburgh Score:    08/03/2022    4:57 PM  Edinburgh Postnatal Depression Scale Screening Tool  I have been able to laugh and see the funny side of things. 0  I have looked forward with enjoyment to things. 0  I have blamed myself unnecessarily when things went wrong. 1  I have been anxious or worried for no good reason. 0  I have felt scared or panicky for no good reason. 0  Things have been getting on top of me. 1  I have been so unhappy that I have had difficulty sleeping. 0  I have felt sad or miserable. 0  I have been so unhappy that I have been crying. 0  The thought of harming myself has occurred to me. 0  Edinburgh Postnatal Depression Scale Total 2      After visit meds:  Allergies as of 08/04/2022   No Known Allergies      Medication List     TAKE these medications    calcium carbonate 500 MG chewable tablet Commonly known as: TUMS - dosed in mg elemental calcium Chew 1-2 tablets by mouth daily as needed for indigestion or heartburn.   ibuprofen 600 MG tablet Commonly known as: ADVIL Take 1 tablet (600 mg total) by mouth every 6 (six) hours as needed.   multivitamin-prenatal 27-0.8 MG Tabs tablet Take 1 tablet by mouth in the morning.   sertraline 50 MG tablet Commonly known as: ZOLOFT TAKE 1 TABLET BY MOUTH ONCE DAILY.         Discharge  home in stable condition Infant Feeding: Breast Infant Disposition:home with mother Discharge instruction: per After Visit Summary and Postpartum booklet. Activity: Advance as tolerated. Pelvic rest for 6 weeks.  Diet: routine diet Anticipated Birth Control: Unsure Postpartum Appointment:   6 weels Additional Postpartum F/U:    Future Appointments:No future appointments. Follow up Visit:      08/04/2022 Luz Lex, MD

## 2022-08-05 NOTE — Anesthesia Postprocedure Evaluation (Signed)
Anesthesia Post Note  Patient: Brianna Bell  Procedure(s) Performed: REPEAT CESAREAN SECTION EDC: 08-10-22 ALLERG: NKDA  PREVIOUS X 2 (Abdomen)     Patient location during evaluation: PACU Anesthesia Type: Spinal Level of consciousness: awake Pain management: pain level controlled Vital Signs Assessment: post-procedure vital signs reviewed and stable Respiratory status: spontaneous breathing Cardiovascular status: stable Postop Assessment: no headache, no backache, spinal receding, patient able to bend at knees and no apparent nausea or vomiting Anesthetic complications: no  No notable events documented.  Last Vitals:  Vitals:   08/04/22 0034 08/04/22 0556  BP: (!) 97/55 (!) 100/54  Pulse: 73 76  Resp: 17 17  Temp: 37 C 36.8 C  SpO2: 99% 99%    Last Pain:  Vitals:   08/04/22 1220  TempSrc:   PainSc: Crawford Jr

## 2022-08-14 ENCOUNTER — Telehealth (HOSPITAL_COMMUNITY): Payer: Self-pay | Admitting: *Deleted

## 2022-08-14 NOTE — Telephone Encounter (Signed)
Left phone voicemail message.  Odis Hollingshead, RN 08-14-2022 at 3:26pm
